# Patient Record
Sex: Female | Born: 1975 | Race: Black or African American | Hispanic: No | Marital: Single | Smoking: Current some day smoker
Health system: Southern US, Community
[De-identification: ages and names within clinical notes are randomized; demographics above are authoritative.]

## PROBLEM LIST (undated history)

## (undated) ENCOUNTER — Ambulatory Visit (HOSPITAL_COMMUNITY): Disposition: A | Discharge status: Other Acute Inpatient Hospital | Payer: Managed Care, Other (non HMO)

## (undated) DIAGNOSIS — T8859XA Other complications of anesthesia, initial encounter: Secondary | ICD-10-CM

## (undated) DIAGNOSIS — Z8489 Family history of other specified conditions: Secondary | ICD-10-CM

## (undated) DIAGNOSIS — F32A Depression, unspecified: Secondary | ICD-10-CM

## (undated) DIAGNOSIS — N390 Urinary tract infection, site not specified: Secondary | ICD-10-CM

## (undated) DIAGNOSIS — J45909 Unspecified asthma, uncomplicated: Secondary | ICD-10-CM

## (undated) DIAGNOSIS — R51 Headache: Secondary | ICD-10-CM

## (undated) DIAGNOSIS — R519 Headache, unspecified: Secondary | ICD-10-CM

## (undated) DIAGNOSIS — L309 Dermatitis, unspecified: Secondary | ICD-10-CM

## (undated) DIAGNOSIS — N289 Disorder of kidney and ureter, unspecified: Secondary | ICD-10-CM

## (undated) DIAGNOSIS — J302 Other seasonal allergic rhinitis: Secondary | ICD-10-CM

## (undated) HISTORY — PX: MOUTH SURGERY: SHX715

## (undated) HISTORY — DX: Other complications of anesthesia, initial encounter: T88.59XA

## (undated) HISTORY — DX: Family history of other specified conditions: Z84.89

---

## 2007-09-03 ENCOUNTER — Inpatient Hospital Stay (HOSPITAL_COMMUNITY): Admission: EM | Admit: 2007-09-03 | Discharge: 2007-09-06 | Payer: Self-pay | Admitting: Emergency Medicine

## 2007-12-03 ENCOUNTER — Emergency Department (HOSPITAL_BASED_OUTPATIENT_CLINIC_OR_DEPARTMENT_OTHER): Admission: EM | Admit: 2007-12-03 | Discharge: 2007-12-04 | Payer: Self-pay | Admitting: Emergency Medicine

## 2007-12-03 ENCOUNTER — Emergency Department (HOSPITAL_COMMUNITY): Admission: EM | Admit: 2007-12-03 | Discharge: 2007-12-03 | Payer: Self-pay | Admitting: Emergency Medicine

## 2007-12-06 ENCOUNTER — Emergency Department (HOSPITAL_COMMUNITY): Admission: EM | Admit: 2007-12-06 | Discharge: 2007-12-07 | Payer: Self-pay | Admitting: Emergency Medicine

## 2009-03-29 IMAGING — CR DG CHEST 1V PORT
1 series · 1 of 1 positions shown · non-contrast
Comparison: 09/04/2007

CLINICAL DATA: 31-year female chest pain

PORTABLE CHEST - 1 VIEW

[AP]
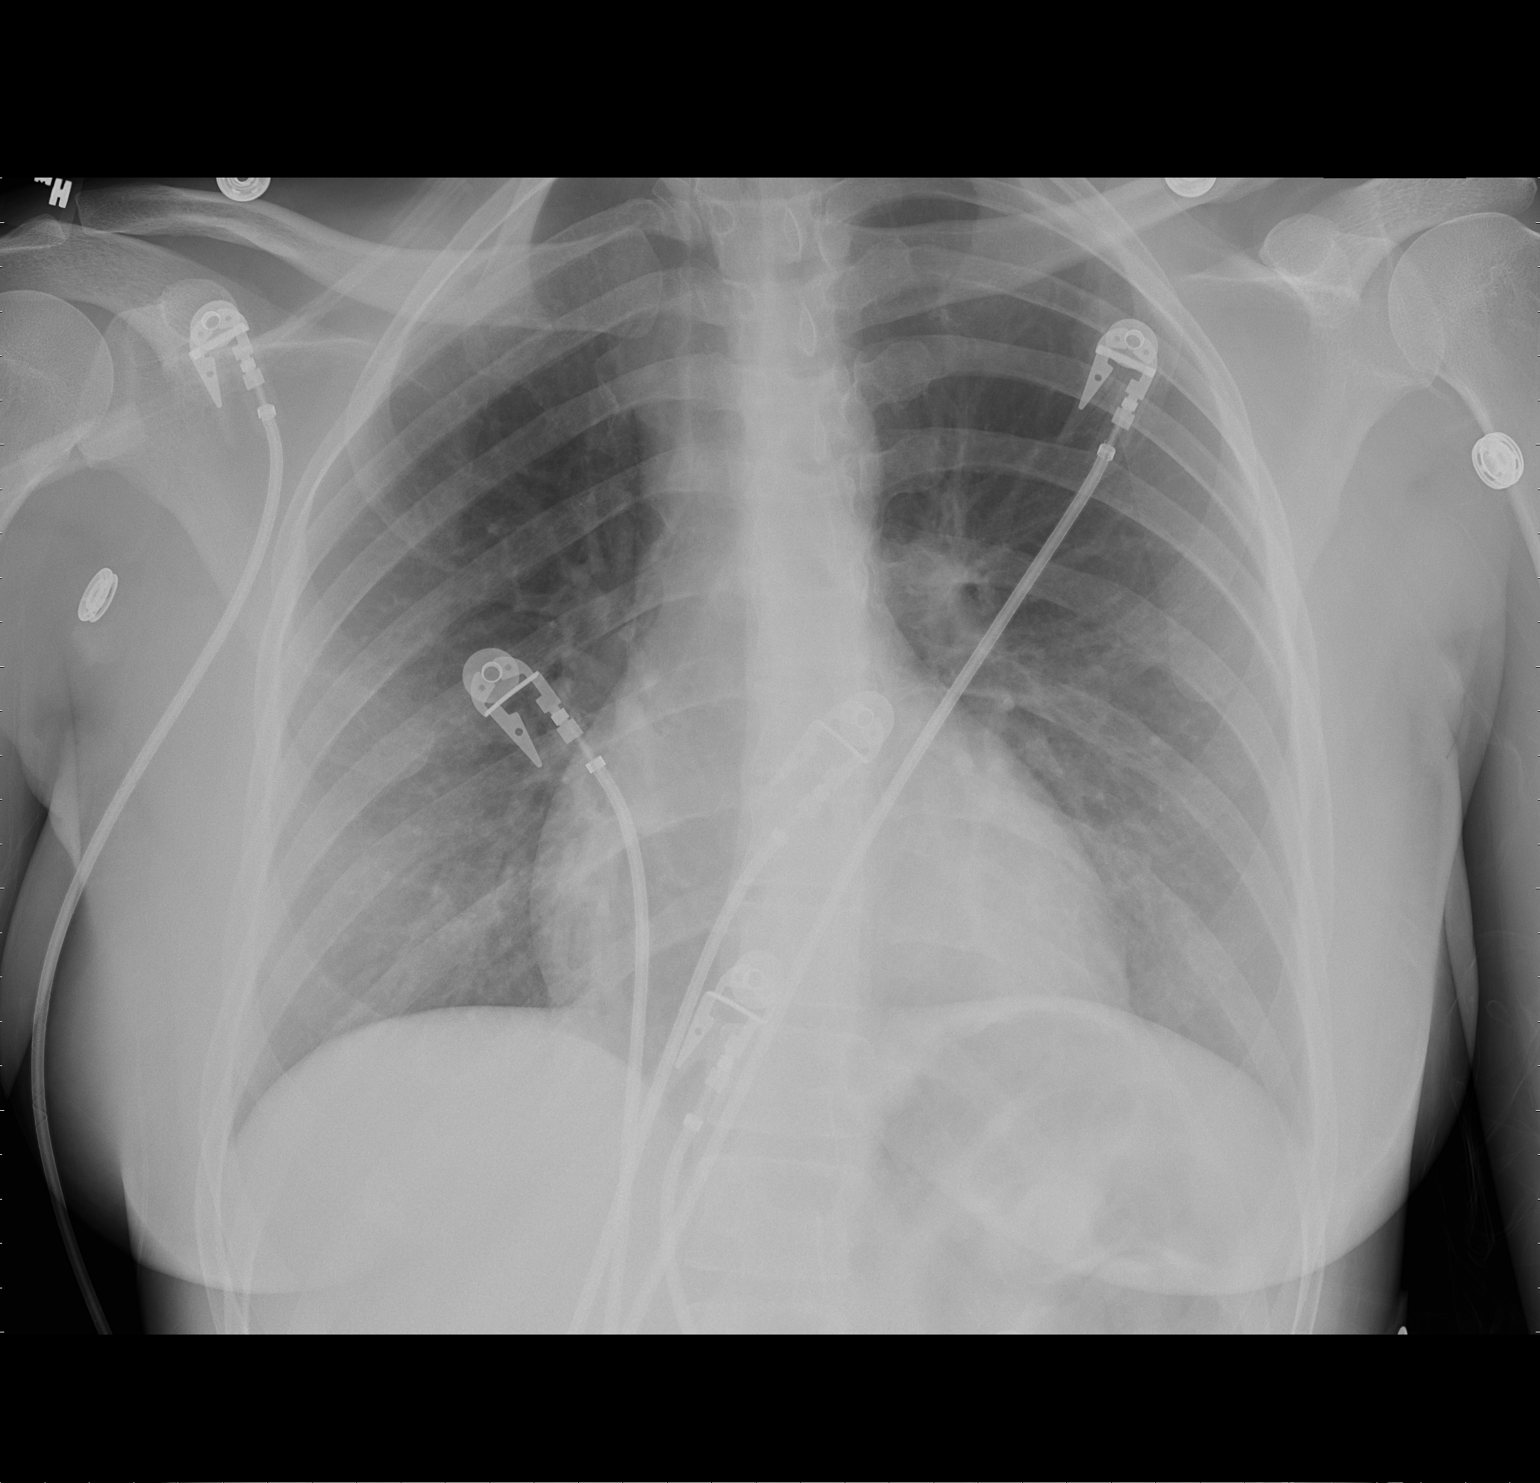

[1 of 1 positions shown; findings below may reference images not displayed]

FINDINGS: Normal heart size and vascularity.  Lungs remain clear.
No edema, pneumonia, effusion or pneumothorax.  Trachea is midline.
IMPRESSION: No acute finding.

## 2009-05-06 ENCOUNTER — Emergency Department (HOSPITAL_COMMUNITY): Admission: EM | Admit: 2009-05-06 | Discharge: 2009-05-06 | Payer: Self-pay | Admitting: Emergency Medicine

## 2009-05-08 ENCOUNTER — Emergency Department (HOSPITAL_COMMUNITY): Admission: EM | Admit: 2009-05-08 | Discharge: 2009-05-08 | Payer: Self-pay | Admitting: Emergency Medicine

## 2009-06-27 ENCOUNTER — Emergency Department (HOSPITAL_COMMUNITY): Admission: EM | Admit: 2009-06-27 | Discharge: 2009-06-27 | Payer: Self-pay | Admitting: Emergency Medicine

## 2010-08-23 LAB — URINALYSIS, ROUTINE W REFLEX MICROSCOPIC
Glucose, UA: NEGATIVE mg/dL
Ketones, ur: 15 mg/dL — AB
Nitrite: NEGATIVE
Protein, ur: 30 mg/dL — AB

## 2010-08-23 LAB — URINE MICROSCOPIC-ADD ON

## 2010-09-09 LAB — RAPID STREP SCREEN (MED CTR MEBANE ONLY): Streptococcus, Group A Screen (Direct): NEGATIVE

## 2010-10-20 NOTE — H&P (Signed)
[name], [name] NO.:  [id number]   MEDICAL RECORD NO.:  [id number]          PATIENT TYPE:  EMS   LOCATION:  MAJO                         FACILITY:  [facility]   PHYSICIAN:  Lonia Blood, M.D.      DATE OF BIRTH:  03/09/1976   DATE OF ADMISSION:  09/02/2007  DATE OF DISCHARGE:                              HISTORY & PHYSICAL   PRIMARY CARE PHYSICIAN:  The patient is unassigned to [place].   PRESENTING COMPLAINT:  Nausea, vomiting and fever.   HISTORY OF PRESENT ILLNESS:  The patient is a 35 year old female that  presented with fever, chills, nausea, vomiting and flank pain.  Symptoms  started 3 days ago and they progressively worsened to the point that the  patient could not stay home.  She could not also keep anything down.  Hence, she decided to come to the emergency room.  She is currently  shivering, still having fever.  Denied any abdominal pain.  Denied any  diarrhea.  No hematochezia.  No hematemesis.   PAST MEDICAL HISTORY:  Significant only for asthma.   ALLERGIES:  She is allergic to PENICILLINS.   MEDICATIONS:  Only albuterol inhaler.   SOCIAL HISTORY:  The patient denied any tobacco or use.  She is a social  alcohol drinker.   FAMILY HISTORY:  Denied any significant family history.   REVIEW OF SYSTEMS:  Mainly weakness, tiredness.  Otherwise, 12 point  review of systems as per HPI.   EXAM:  Temperature 101.5, blood pressure 105/74, pulse 102, respiratory  rate 20, saturation 100% on room air.  GENERAL:  The patient is awake, alert, oriented, but in obvious  distress, shaking and covered with multiple blankets.  HEENT: PERRL.  EOMI.  Mucous membrane looks slightly dry.  NECK:  Supple.  No JVD, no lymphadenopathy.  RESPIRATORY:  She has good air entry bilaterally.  No wheezes or rales.  CARDIOVASCULAR:  She is mildly tachycardiac.  ABDOMEN:  Soft and nontender with positive bowel sounds.  EXTREMITIES:  No edema, cyanosis or clubbing.  The patient  also has right CVA tenderness.   Her labs showed white count 19.5 thousand with a left shift, ANC of  17.2, hemoglobin 14.5, platelet count 207.  Pregnancy test is negative.  Sodium 135, potassium 2.9, chloride 99, CO2 25, glucose 107, BUN 6,  creatinine 0.9 and calcium 9.1, total protein 8.0, albumin 3.9, AST 26.  Lipase is 15.  Urinalysis showed amber, cloudy urine with moderate  hemoglobin, small bilirubin, ketonuria, mild proteinuria, .  nitrite  positive, and large leukocyte esterase.  Wbc's 21-50, rbc's 306, and  many bacteria.   ASSESSMENT:  Therefore, this is a 35 year old female presenting with  fever, chills, nausea and vomiting, with all symptoms of systemic  inflammatory response syndrome, with evidence of urinary tract  infection.  The most likely thing, therefore, is that the patient has an  acute pyelonephritis, especially with the costovertebral angle  tenderness.  The patient is shivering, still having fevers, hence she is  not being controlled with oral medications.   PLAN:  1. Acute pyelonephritis.  We will admit the  patient to a regular bed.      We will start IV ciprofloxacin.  Get blood cultures, urine culture.      Follow her progress in the hospital.  2. Hypokalemia.  We will replete potassium and observe the patient in      the hospital.  Will follow her potassium level closely.  3. Asthma.  The patient is not having any asthmatic attack.  However,      we will keep her on some albuterol and Atrovent nebulizers as      needed while in the hospital.   Otherwise, the patient is expected to make a full recovery soon and will  be discharged home accordingly.      Lonia Blood, M.D.  Electronically Signed     LG/MEDQ  D:  09/03/2007  T:  09/04/2007  Job:  [id number]

## 2010-10-23 NOTE — Discharge Summary (Signed)
[name], [name] NO.:  [id number]   MEDICAL RECORD NO.:  [id number]          PATIENT TYPE:  INP   LOCATION:  5506                         FACILITY:  [facility]   PHYSICIAN:  Lonia Blood, M.D.       DATE OF BIRTH:  February 06, 1976   DATE OF ADMISSION:  09/03/2007  DATE OF DISCHARGE:  09/06/2007                               DISCHARGE SUMMARY   PRIMARY CARE PHYSICIAN:  The patient's primary care physician is in  [place]   DISCHARGE DIAGNOSES:  1. Escherichia coli urinary tract infection.  2. Dehydration - resolved.  3. History of asthma.   DISCHARGE MEDICATIONS:  Ciprofloxacin 500 mg by mouth twice a day for 1  week.   CONDITION ON DISCHARGE:  The patient was discharged in good condition  without any need for immediate followup.  She will return to [place] to follow up with her primary care physician as needed.   PROCEDURES DURING THIS ADMISSION:  1. On September 03, 2007, the patient had an ultrasound of the abdomen      with findings of normal-appearing kidneys.  2. On September 04, 2007, CT scan of the chest, negative for pulmonary      embolus and right pyelonephritis.   HISTORY AND PHYSICAL:  Refer to the dictated H&P, which was done by Dr.  Mikeal Hawthorne on September 02, 2007.   HOSPITAL COURSE:  1. Acute pyelonephritis.  [name] was admitted to regular bed      with presumed diagnosis of acute pyelonephritis.  Urine culture      grew Escherichia coli, sensitive to ciprofloxacin.  CT scan did      confirm presence of right pyelonephritis.  [name] was placed      on intravenous fluids, intravenous ciprofloxacin, and she did      improve while in the hospital.  Blood cultures were negative.      Urine culture indicated and isolated, was sensitive to the      antibiotic used.  On September 06, 2007, as the patient was able      tolerate a good regular diet, we decided to discharge the patient      home with a course of 1 week of oral  ciprofloxacin.      Lonia Blood, M.D.  Electronically Signed     SL/MEDQ  D:  09/12/2007  T:  09/13/2007  Job:  [id number]

## 2011-03-01 LAB — CBC
HCT: 35.6 — ABNORMAL LOW
Hemoglobin: 11.8 — ABNORMAL LOW
Hemoglobin: 12.1
MCHC: 33.8
MCHC: 33.8
MCHC: 34
MCV: 92.6
MCV: 92.7
Platelets: 207
RBC: 3.85 — ABNORMAL LOW
RBC: 4.64
RDW: 13
RDW: 13.7
WBC: 9.3

## 2011-03-01 LAB — CULTURE, BLOOD (ROUTINE X 2)
Culture: NO GROWTH
Culture: NO GROWTH

## 2011-03-01 LAB — DIFFERENTIAL
Eosinophils Absolute: 0
Eosinophils Relative: 0
Lymphocytes Relative: 5 — ABNORMAL LOW
Lymphs Abs: 1
Monocytes Relative: 6

## 2011-03-01 LAB — URINALYSIS, ROUTINE W REFLEX MICROSCOPIC
Ketones, ur: >80 — AB
Nitrite: POSITIVE — AB
Urobilinogen, UA: 1
pH: 6.5

## 2011-03-01 LAB — COMPREHENSIVE METABOLIC PANEL
ALT: 16
ALT: 19
AST: 26
Albumin: 3.9
CO2: 20
Calcium: 8.2 — ABNORMAL LOW
Calcium: 9.1
Creatinine, Ser: 0.92
GFR calc Af Amer: >60
GFR calc non Af Amer: >60
Glucose, Bld: 95
Sodium: 135
Sodium: 136
Total Protein: 6.4
Total Protein: 8

## 2011-03-01 LAB — URINE CULTURE
Colony Count: >=100000
Special Requests: NEGATIVE

## 2011-03-01 LAB — URINE MICROSCOPIC-ADD ON

## 2011-03-01 LAB — BASIC METABOLIC PANEL
CO2: 21
Chloride: 107
Creatinine, Ser: 0.81
GFR calc Af Amer: >60
Potassium: 3.7
Sodium: 137

## 2011-03-02 LAB — CBC
HCT: 37.7
Hemoglobin: 12.8
MCHC: 34
MCV: 91.7
Platelets: 219
RBC: 4.11
RDW: 13.3
WBC: 8.3

## 2011-03-02 LAB — BASIC METABOLIC PANEL
BUN: 2 — ABNORMAL LOW
CO2: 25
Calcium: 8.2 — ABNORMAL LOW
Chloride: 106
Creatinine, Ser: 0.76
GFR calc Af Amer: >60
Glucose, Bld: 121 — ABNORMAL HIGH

## 2011-03-04 LAB — DIFFERENTIAL
Basophils Absolute: 0
Basophils Relative: 0
Eosinophils Relative: 0
Lymphocytes Relative: 16
Lymphocytes Relative: 9 — ABNORMAL LOW
Lymphs Abs: 1.2
Monocytes Relative: 8
Neutro Abs: 6.9
Neutrophils Relative %: 76

## 2011-03-04 LAB — CBC
Hemoglobin: 14.3
MCHC: 33.5
MCHC: 33.9
MCV: 90
Platelets: 219
Platelets: 210
RDW: 13.1
RDW: 13.2

## 2011-03-04 LAB — URINE MICROSCOPIC-ADD ON

## 2011-03-04 LAB — URINALYSIS, ROUTINE W REFLEX MICROSCOPIC
Bilirubin Urine: NEGATIVE
Glucose, UA: NEGATIVE
Glucose, UA: NEGATIVE
Glucose, UA: NEGATIVE
Ketones, ur: >80 — AB
Leukocytes, UA: NEGATIVE
Nitrite: NEGATIVE
Protein, ur: 100 — AB
Protein, ur: NEGATIVE
Specific Gravity, Urine: 1.03
Specific Gravity, Urine: 1.033 — ABNORMAL HIGH
Urobilinogen, UA: 1
pH: 6
pH: 6.5

## 2011-03-04 LAB — COMPREHENSIVE METABOLIC PANEL
AST: 15
Albumin: 3.6
CO2: 19
Calcium: 9
Creatinine, Ser: 0.94
GFR calc Af Amer: >60
GFR calc non Af Amer: >60
Sodium: 137
Total Protein: 7.5

## 2011-03-04 LAB — URINE CULTURE
Colony Count: NO GROWTH
Culture: NO GROWTH

## 2011-03-04 LAB — POCT I-STAT, CHEM 8
Chloride: 103
Glucose, Bld: 98
HCT: 46
Hemoglobin: 15.6 — ABNORMAL HIGH
Potassium: 3.5
Sodium: 139

## 2011-03-04 LAB — D-DIMER, QUANTITATIVE: D-Dimer, Quant: 0.34

## 2011-03-04 LAB — POCT CARDIAC MARKERS
CKMB, poc: <1 — ABNORMAL LOW
CKMB, poc: <1 — ABNORMAL LOW
Operator id: 285491
Troponin i, poc: <0.05

## 2011-03-04 LAB — POCT PREGNANCY, URINE: Operator id: 264421

## 2011-03-04 LAB — LIPASE, BLOOD: Lipase: 15

## 2013-11-24 ENCOUNTER — Emergency Department (HOSPITAL_COMMUNITY)
Admission: EM | Admit: 2013-11-24 | Discharge: 2013-11-24 | Disposition: A | Discharge status: Home / Self Care | Payer: PRIVATE HEALTH INSURANCE | Attending: Emergency Medicine | Admitting: Emergency Medicine

## 2013-11-24 ENCOUNTER — Encounter (HOSPITAL_COMMUNITY): Payer: Self-pay | Admitting: Emergency Medicine

## 2013-11-24 DIAGNOSIS — IMO0002 Reserved for concepts with insufficient information to code with codable children: Secondary | ICD-10-CM | POA: Diagnosis not present

## 2013-11-24 DIAGNOSIS — T23231A Burn of second degree of multiple right fingers (nail), not including thumb, initial encounter: Secondary | ICD-10-CM

## 2013-11-24 DIAGNOSIS — T23269A Burn of second degree of back of unspecified hand, initial encounter: Secondary | ICD-10-CM | POA: Insufficient documentation

## 2013-11-24 DIAGNOSIS — F17200 Nicotine dependence, unspecified, uncomplicated: Secondary | ICD-10-CM | POA: Diagnosis not present

## 2013-11-24 DIAGNOSIS — Y9269 Other specified industrial and construction area as the place of occurrence of the external cause: Secondary | ICD-10-CM | POA: Insufficient documentation

## 2013-11-24 DIAGNOSIS — Z87448 Personal history of other diseases of urinary system: Secondary | ICD-10-CM | POA: Insufficient documentation

## 2013-11-24 DIAGNOSIS — Z880 Allergy status to penicillin: Secondary | ICD-10-CM | POA: Diagnosis not present

## 2013-11-24 DIAGNOSIS — Z79899 Other long term (current) drug therapy: Secondary | ICD-10-CM | POA: Diagnosis not present

## 2013-11-24 DIAGNOSIS — Y9389 Activity, other specified: Secondary | ICD-10-CM | POA: Diagnosis not present

## 2013-11-24 DIAGNOSIS — T23029A Burn of unspecified degree of unspecified single finger (nail) except thumb, initial encounter: Secondary | ICD-10-CM | POA: Diagnosis present

## 2013-11-24 HISTORY — DX: Disorder of kidney and ureter, unspecified: N28.9

## 2013-11-24 MED ORDER — SILVER SULFADIAZINE 1 % EX CREA
TOPICAL_CREAM | Freq: Once | CUTANEOUS | Status: AC
  Administered 2013-11-24: 06:00:00 via TOPICAL
  Filled 2013-11-24: qty 85

## 2013-11-24 MED ORDER — HYDROCODONE-ACETAMINOPHEN 5-325 MG PO TABS: 1.0000 | ORAL_TABLET | ORAL | Status: DC | PRN

## 2013-11-24 MED ORDER — HYDROCODONE-ACETAMINOPHEN 5-325 MG PO TABS
1.0000 | ORAL_TABLET | Freq: Once | ORAL | Status: AC
  Administered 2013-11-24: 1 via ORAL
  Filled 2013-11-24: qty 1

## 2013-11-24 MED ORDER — SILVER SULFADIAZINE 1 % EX CREA
TOPICAL_CREAM | Freq: Every day | CUTANEOUS | Status: DC
Start: 2013-11-24 — End: 2015-12-24

## 2013-11-24 NOTE — ED Notes (Signed)
Pt states she was at work and was burned on her right hand by a "heat activator" cleaning type solution.

## 2013-11-24 NOTE — ED Provider Notes (Signed)
CSN: [id number]     Arrival date & time 11/24/13  0459 History   First MD Initiated Contact with Patient 11/24/13 [phone]     Chief Complaint  Patient presents with  . Burn     (Consider location/radiation/quality/duration/timing/severity/associated sxs/prior Treatment) HPI Patient sustained a chemical burn to her right hand while at work. She states she got a few splashes on the dorsum of her right hand. She's having pain at the site. There is one small blister. There is no circumferential burn. Patient denies any other injury. She has no numbness or decreased range of motion. Past Medical History  Diagnosis Date  . Renal disorder    History reviewed. No pertinent past surgical history. No family history on file. History  Substance Use Topics  . Smoking status: Current Every Day Smoker -- 0.50 packs/day    Types: Cigarettes  . Smokeless tobacco: Not on file  . Alcohol Use: Yes     Comment: socially   OB History   Grav Para Term Preterm Abortions TAB SAB Ect Mult Living                 Review of Systems  Constitutional: Negative for fever and chills.  Gastrointestinal: Negative for nausea and vomiting.  Skin: Positive for rash and wound.  Neurological: Negative for dizziness, weakness and headaches.  All other systems reviewed and are negative.     Allergies  Penicillins  Home Medications   Prior to Admission medications   Medication Sig Start Date End Date Taking? Authorizing Provider  BIOTIN PO Take 1 tablet by mouth daily.   Yes Historical Provider, MD  calcium carbonate (OS-CAL) 600 MG TABS tablet Take 600 mg by mouth 2 (two) times daily with a meal.   Yes Historical Provider, MD  Multiple Vitamin (MULTIVITAMIN WITH MINERALS) TABS tablet Take 1 tablet by mouth daily.   Yes Historical Provider, MD   BP 118/76  Pulse 91  Temp(Src) 98.6 F (37 C) (Oral)  Resp 20  SpO2 98% Physical Exam  Nursing note and vitals reviewed. Constitutional: She is oriented to  person, place, and time. She appears well-developed and well-nourished. No distress.  HENT:  Head: Normocephalic and atraumatic.  Mouth/Throat: Oropharynx is clear and moist.  Eyes: EOM are normal. Pupils are equal, round, and reactive to light.  Neck: Normal range of motion. Neck supple.  Cardiovascular: Normal rate and regular rhythm.   Pulmonary/Chest: Effort normal and breath sounds normal.  Abdominal: Soft. Bowel sounds are normal.  Musculoskeletal: Normal range of motion. She exhibits no edema and no tenderness.  Neurological: She is alert and oriented to person, place, and time.  Sensation intact  Skin: Skin is warm and dry. No rash noted. No erythema.  Superficial partial-thickness burns to the dorsum of the right hand. The most significant area is between the second and third digit in the web space. There is a small bulla. There is no circumferential burning. Total burn surface area it is much less than 1%  Psychiatric: She has a normal mood and affect. Her behavior is normal.    ED Course  Procedures (including critical care time) Labs Review Labs Reviewed - No data to display  Imaging Review No results found.   EKG Interpretation None      MDM   Final diagnoses:  None    Will apply Silvadene and dressing wound. Patient educated on signs of infection and when to return. I do not believe that she needs followup given the  sparse burn pattern.     Loren Raceravid Yelverton, MD 11/24/13 [phone]

## 2013-11-24 NOTE — Discharge Instructions (Signed)
Burn Care Your skin is a natural barrier to infection. It is the largest organ of your body. Burns damage this natural protection. To help prevent infection, it is very important to follow your caregiver's instructions in the care of your burn. Burns are classified as:  First degree. There is only redness of the skin (erythema). No scarring is expected.  Second degree. There is blistering of the skin. Scarring may occur with deeper burns.  Third degree. All layers of the skin are injured, and scarring is expected. HOME CARE INSTRUCTIONS   Wash your hands well before changing your bandage.  Change your bandage as often as directed by your caregiver.  Remove the old bandage. If the bandage sticks, you may soak it off with cool, clean water.  Cleanse the burn thoroughly but gently with mild soap and water.  Pat the area dry with a clean, dry cloth.  Apply a thin layer of antibacterial cream to the burn.  Apply a clean bandage as instructed by your caregiver.  Keep the bandage as clean and dry as possible.  Elevate the affected area for the first 24 hours, then as instructed by your caregiver.  Only take over-the-counter or prescription medicines for pain, discomfort, or fever as directed by your caregiver. SEEK IMMEDIATE MEDICAL CARE IF:   You develop excessive pain.  You develop redness, tenderness, swelling, or red streaks near the burn.  The burned area develops yellowish-white fluid (pus) or a bad smell.  You have a fever. MAKE SURE YOU:   Understand these instructions.  Will watch your condition.  Will get help right away if you are not doing well or get worse. Document Released: 05/24/2005 Document Revised: 08/16/2011 Document Reviewed: 10/14/2010 Exit[employer] Patient Information 2015 [employer]. This information is not intended to replace advice given to you by your health care provider. Make sure you discuss any questions you have with your health care  provider.  

## 2014-04-22 ENCOUNTER — Encounter (HOSPITAL_COMMUNITY): Payer: Self-pay | Admitting: Emergency Medicine

## 2014-04-22 ENCOUNTER — Emergency Department (HOSPITAL_COMMUNITY): Payer: PRIVATE HEALTH INSURANCE

## 2014-04-22 ENCOUNTER — Emergency Department (HOSPITAL_COMMUNITY)
Admission: EM | Admit: 2014-04-22 | Discharge: 2014-04-22 | Disposition: A | Discharge status: Home / Self Care | Payer: PRIVATE HEALTH INSURANCE | Attending: Emergency Medicine | Admitting: Emergency Medicine

## 2014-04-22 DIAGNOSIS — Y9289 Other specified places as the place of occurrence of the external cause: Secondary | ICD-10-CM | POA: Insufficient documentation

## 2014-04-22 DIAGNOSIS — Z880 Allergy status to penicillin: Secondary | ICD-10-CM | POA: Diagnosis not present

## 2014-04-22 DIAGNOSIS — Z79899 Other long term (current) drug therapy: Secondary | ICD-10-CM | POA: Diagnosis not present

## 2014-04-22 DIAGNOSIS — Z720 Tobacco use: Secondary | ICD-10-CM | POA: Diagnosis not present

## 2014-04-22 DIAGNOSIS — S6992XA Unspecified injury of left wrist, hand and finger(s), initial encounter: Secondary | ICD-10-CM | POA: Diagnosis not present

## 2014-04-22 DIAGNOSIS — Y9389 Activity, other specified: Secondary | ICD-10-CM | POA: Diagnosis not present

## 2014-04-22 DIAGNOSIS — M25532 Pain in left wrist: Secondary | ICD-10-CM

## 2014-04-22 DIAGNOSIS — Z87448 Personal history of other diseases of urinary system: Secondary | ICD-10-CM | POA: Diagnosis not present

## 2014-04-22 DIAGNOSIS — X58XXXA Exposure to other specified factors, initial encounter: Secondary | ICD-10-CM | POA: Insufficient documentation

## 2014-04-22 DIAGNOSIS — Y998 Other external cause status: Secondary | ICD-10-CM | POA: Insufficient documentation

## 2014-04-22 DIAGNOSIS — R52 Pain, unspecified: Secondary | ICD-10-CM

## 2014-04-22 MED ORDER — HYDROCODONE-ACETAMINOPHEN 5-325 MG PO TABS
ORAL_TABLET | ORAL | Status: DC
Start: 2014-04-22 — End: 2015-12-24

## 2014-04-22 NOTE — ED Notes (Signed)
Patient having pain in left wrist and she has shooting pain up her arm when she tries to pick anything up heavier than 2lbs.

## 2014-04-22 NOTE — ED Provider Notes (Signed)
CSN: [id number]     Arrival date & time 04/22/14  [id number] History   First MD Initiated Contact with Patient 04/22/14 0247     Chief Complaint  Patient presents with  . Wrist Pain      HPI Pt was seen at 0240. Per pt, c/o gradual onset and persistence of constant left wrist "pain" for the past 2 weeks. Pt states the pain worsens when she tries to "pick something up over 2 lbs," and moves her wrist F/E.  Pt states the pain "shoots up the side of my forearm." Pt works at a [employer] and is right handed. Denies direct injury. Denies focal motor weakness, no tingling/numbness in extremity.    Past Medical History  Diagnosis Date  . Renal disorder    History reviewed. No pertinent past surgical history.  History  Substance Use Topics  . Smoking status: Current Every Day Smoker -- 0.50 packs/day    Types: Cigarettes  . Smokeless tobacco: Not on file  . Alcohol Use: Yes     Comment: socially    Review of Systems ROS: Statement: All systems negative except as marked or noted in the HPI; Constitutional: Negative for fever and chills. ; ; Eyes: Negative for eye pain, redness and discharge. ; ; ENMT: Negative for ear pain, hoarseness, nasal congestion, sinus pressure and sore throat. ; ; Cardiovascular: Negative for chest pain, palpitations, diaphoresis, dyspnea and peripheral edema. ; ; Respiratory: Negative for cough, wheezing and stridor. ; ; Gastrointestinal: Negative for nausea, vomiting, diarrhea, abdominal pain, blood in stool, hematemesis, jaundice and rectal bleeding. . ; ; Genitourinary: Negative for dysuria, flank pain and hematuria. ; ; Musculoskeletal: +left wrist pain. Negative for back pain and neck pain. Negative for swelling and trauma.; ; Skin: Negative for pruritus, rash, abrasions, blisters, bruising and skin lesion.; ; Neuro: Negative for headache, lightheadedness and neck stiffness. Negative for weakness, altered level of consciousness , altered mental status,  extremity weakness, paresthesias, involuntary movement, seizure and syncope.      Allergies  Penicillins  Home Medications   Prior to Admission medications   Medication Sig Start Date End Date Taking? Authorizing Provider  BIOTIN PO Take 1 tablet by mouth daily.   Yes Historical Provider, MD  calcium carbonate (OS-CAL) 600 MG TABS tablet Take 600 mg by mouth 2 (two) times daily with a meal.   Yes Historical Provider, MD  Multiple Vitamin (MULTIVITAMIN WITH MINERALS) TABS tablet Take 1 tablet by mouth daily.   Yes Historical Provider, MD  Specialty Vitamins Products (ECHINACEA C COMPLETE PO) Take 1 tablet by mouth daily.   Yes Historical Provider, MD  HYDROcodone-acetaminophen (NORCO) 5-325 MG per tablet Take 1 tablet by mouth every 4 (four) hours as needed. Patient not taking: Reported on 04/22/2014 11/24/13   Loren Raceravid Yelverton, MD  silver sulfADIAZINE (SILVADENE) 1 % cream Apply topically daily. To affected area Patient not taking: Reported on 04/22/2014 11/24/13   Loren Raceravid Yelverton, MD   BP 129/87 mmHg  Pulse 86  Temp(Src) 98.3 F (36.8 C) (Oral)  Resp 16  Ht 5\' 2"  (1.575 m)  Wt 128 lb (58.06 kg)  BMI 23.41 kg/m2  SpO2 100%  LMP 04/15/2014 Physical Exam 0245: Physical examination:  Nursing notes reviewed; Vital signs and O2 SAT reviewed;  Constitutional: Well developed, Well nourished, Well hydrated, In no acute distress; Head:  Normocephalic, atraumatic; Eyes: EOMI, PERRL, No scleral icterus; ENMT: Mouth and pharynx normal, Mucous membranes moist; Neck: Supple, Full range of motion, No lymphadenopathy;  Cardiovascular: Regular rate and rhythm, No murmur, rub, or gallop; Respiratory: Breath sounds clear & equal bilaterally, No rales, rhonchi, wheezes.  Speaking full sentences with ease, Normal respiratory effort/excursion; Chest: Nontender, Movement normal; Abdomen: Soft, Nontender, Nondistended, Normal bowel sounds;; Extremities: Pulses normal, NT left fingers/hand/elbow. Left wrist  without edema, ecchymosis, erythema or deformity. No left snuffbox tenderness.  No pain to axial thumb or 3rd MCP loading. Left forearm compartments soft, strong radial pp, brisk cap refill in fingers. Left hand NMS intact. Negative Tinel's. +Finkelstein's test..; Neuro: AA&Ox3, Major CN grossly intact.  Speech clear. No gross focal motor or sensory deficits in extremities. Climbs on and off stretcher easily by herself. Gait steady.; Skin: Color normal, Warm, Dry.   ED Course  Procedures     MDM  MDM Reviewed: previous chart, nursing note and vitals Interpretation: x-ray      Dg Wrist Complete Left 04/22/2014   CLINICAL DATA:  Entire wrist pain after an injury while lifting of 5 lb bag of food.  EXAM: LEFT WRIST - COMPLETE 3+ VIEW  COMPARISON:  None.  FINDINGS: There is no evidence of fracture or dislocation. There is no evidence of arthropathy or other focal bone abnormality. Soft tissues are unremarkable.  IMPRESSION: Negative.   Electronically Signed   By: Burman NievesWilliam  Stevens M.D.   On: 04/22/2014 03:27    0410:  XR reassuring. Will splint, f/u occupational health clinic and Ortho MD. Dx and testing d/w pt and family.  Questions answered.  Verb understanding, agreeable to d/c home with outpt f/u.   Samuel JesterKathleen Marianita Botkin, DO 04/24/14 [phone]

## 2014-04-22 NOTE — Discharge Instructions (Signed)
°Emergency Department Resource Guide °1) Find a Doctor and Pay Out of Pocket °Although you won't have to find out who is covered by your insurance plan, it is a good idea to ask around and get recommendations. You will then need to call the office and see if the doctor you have chosen will accept you as a new patient and what types of options they offer for patients who are self-pay. Some doctors offer discounts or will set up payment plans for their patients who do not have insurance, but you will need to ask so you aren't surprised when you get to your appointment. ° °2) Contact Your Local Health Department °Not all health departments have doctors that can see patients for sick visits, but many do, so it is worth a call to see if yours does. If you don't know where your local health department is, you can check in your phone book. The CDC also has a tool to help you locate your state's health department, and many state websites also have listings of all of their local health departments. ° °3) Find a Walk-in Clinic °If your illness is not likely to be very severe or complicated, you may want to try a walk in clinic. These are popping up all over the country in pharmacies, drugstores, and shopping centers. They're usually staffed by nurse practitioners or physician assistants that have been trained to treat common illnesses and complaints. They're usually fairly quick and inexpensive. However, if you have serious medical issues or chronic medical problems, these are probably not your best option. ° °No Primary Care Doctor: °- Call [facility] at  832-8000 - they can help you locate a primary care doctor that  accepts your insurance, provides certain services, etc. °- Physician Referral Service- 1-800-533-3463 ° °Chronic Pain Problems: °Organization         Address  Phone   Notes  °[facility]  (336) 297-2271 Patients need to be referred by their primary care doctor.  ° °Medication  Assistance: °Organization         Address  Phone   Notes  °[place] [facility] [address] °[place], [place] [address] (336) 641-8030 --Must be a resident of [place] °-- Must have NO insurance coverage whatsoever (no Medicaid/ Medicare, etc.) °-- The pt. MUST have a primary care doctor that directs their care regularly and follows them in the community °  °[facility]  (866) 331-1348   °[employer]  (888) 892-1162   ° °Agencies that provide inexpensive medical care: °Organization         Address  Phone   Notes  °[facility]  (336) 832-8035   °[facility]    (336) 832-7272   °[facility] [address] °[place], [place] [address] (336) 832-4777   °[facility] [address], °[place] (336) 271-4999   °[facility]    (336) 373-0678   °[facility]    (336) 272-1050   °[facility] ° [address], [place] Phone:  (336) 832-4444, Fax:  (336) 832-4440 Hours of Operation:  9 am - 6 pm, M-F.  Also accepts Medicaid/Medicare and self-pay.  °[facility] ° [address], [place] Phone: (336) [phone], Fax: (336) [phone]. Hours of Operation:  8:30 am - 5:30 pm, M-F.  Also accepts Medicaid and self-pay.  °[facility] [place] [address], [place] Phone: (336) 878-6027   °[facility] [address], [place], [place] (336)723-1848, Ext. 123 Mondays & Thursdays: 7-9 AM.  First 15 patients are seen on a first come, first serve basis. °  ° °Medicaid-accepting [place] Providers: ° °Organization         Address  Phone   Notes  °[facility] [address], [place] (336) 641-2100 Also accepts self-pay patients.  °[facility] [address], [place] ° (336) 856-9996   °[facility] [address], [place]  (336) 288-8857   °[facility] [address] [place] [address], [place] (336) 299-7000   °Veita Bland [address], [place]  ° (336) 373-1557 Only accepts [place] Access Medicaid patients after they have their name applied to their card.  ° °Self-Pay (no insurance) in [place]: ° °Organization         Address  Phone   Notes  °Sickle Cell Patients, [facility] [address], [place] (336) 832-1970   °[facility] [address], [place] (336) 832-4400   °[facility] ° [address], [place] (336) 992-4800   °[facility]/Dr. Osei-Bonsu ° [address] [place] [address], [place] or [address], [place] (336) 841-8500 Phone number for both [place] and [place] locations is the same.  °[facility] [address], [place] (336) 299-0000   °[facility] [address] [place] [address], [place] or [address] (336) 852-7530 °(336) 878-2260   °[facility] [address], [place] (336) 350-1642, phone; (336) 294-5005, fax Sees patients 1st and 3rd Saturday of every month.  Must not qualify for public or private insurance (i.e. Medicaid, Medicare, [place] Health Choice, Veterans' Benefits) • Household income should be no more than 200% of the poverty level •The clinic cannot treat you if you are pregnant or think you are pregnant • Sexually transmitted diseases are not treated at the clinic.  ° ° °Dental Care: °Organization         Address  Phone  Notes  °[place] [facility] [address], [place] (336) 641-6152 Accepts children up to age 21 who are enrolled in Medicaid or [place] Health Choice; pregnant women with a Medicaid card; and children who have applied for Medicaid or [place] Health Choice, but were declined, whose parents can pay a reduced fee at time of service.  °[place]  [facility] [place]  [address], [place] (336) 641-7733 Accepts children up to age 21 who are enrolled in Medicaid or [place] Health Choice; pregnant women with a Medicaid card; and children who have applied for Medicaid or [place] Health Choice, but were declined, whose parents can pay a reduced fee at time of service.  °[facility] ° [address], [place] (336) 641-4533 Patients are seen by appointment only. Walk-ins are not accepted. [facility] will see patients 18 years of age and older. °Monday - Tuesday (8am-5pm) °Most Wednesdays (8:30-5pm) °$30 per visit, cash only  °[facility] ° [address], [place] (336) 641-4533 Patients are seen by appointment only. Walk-ins are not accepted. [facility] will see patients 18 years of age and older. °One   Wednesday Evening (Monthly: Volunteer Based).  $30 per visit, cash only  °[facility]  (919) 537-3737 for adults; Children under age 4, call Graduate Pediatric Dentistry at ([phone]) [phone]. Children aged 4-14, please call (919) [phone] to request a pediatric application. ° Dental services are provided in all areas of dental care including fillings, crowns and bridges, complete and partial dentures, implants, gum treatment, root canals, and extractions. Preventive care is also provided. Treatment is provided to both adults and children. °Patients are selected via a lottery and there is often a waiting list. °  °[facility] [address], °[place] ° (336) 763-8833 [email or web address] °  °[facility] [address], [place], [place] (336)723-1848, Ext. 123 Second and Fourth Thursday of each month, opens at 6:30 AM; Clinic ends at 9 AM.  Patients are seen on a first-come first-served basis, and a limited number are seen during each clinic.  ° °[facility] ° [address], [place], [place] (336) 723-7904    Eligibility Requirements °You must have lived in [place], [place], or [place] counties for at least the last three months. °  You cannot be eligible for state or federal sponsored healthcare insurance, including Veterans Administration, Medicaid, or Medicare. °  You generally cannot be eligible for healthcare insurance through your employer.  °  How to apply: °Eligibility screenings are held every Tuesday and Wednesday afternoon from 1:00 pm until 4:00 pm. You do not need an appointment for the interview!  °[facility] [address], [place], [place] 336-631-2330   °[facility]  336-342-8273   °[facility]  336-703-3100   °[facility]  336-570-6415   ° °Behavioral Health Resources in the Community: °Intensive Outpatient Programs °Organization         Address  Phone  Notes  °[facility] [address], [place], [place] 336-878-6098   °[facility] [address], [place], [place] 336-832-9800   °[facility] [address], [place], [place] ° 336-882-2125   °[facility] [address],  °[place], [place] 1-800-853-5163 or 336-641-4981   °Substance Abuse Resources °Organization         Address  Phone  Notes  °[facility]  336-882-2125   °[facility]  336-784-9470   °[facility]  336-285-9073   °[facility]  336-845-3988   °Residential & Outpatient Substance Abuse Program  1-800-659-3381   °Psychological Services °Organization         Address  Phone  Notes  °[facility]  336- 832-9600   °[facility]  336- 378-7881   °[facility] [address], [place] 1-800-853-5163 or 336-641-4981   ° °Mobile Crisis Teams °Organization         Address  Phone  Notes  °[facility], Mobile Crisis Care Unit  1-877-626-1772   °Assertive °[facility] ° [address]  [place], [place] 336-834-9664   °Sharon DeEsch [address] °[place] 336-554-5454   ° °Self-Help/Support Groups °Organization         Address  Phone             Notes  °[facility]  - variety of support groups  336- 373-1402 Call for more information  °Narcotics Anonymous (NA), [facility] [address], °[place]  2 meetings at this location  ° °  Residential Treatment Programs Organization         Address  Phone  Notes  [facility] [address],    [place]  [phone]   [facility]  [address], [place] [id number], [place], [place] [phone]   [facility] [address], [place] [phone] Admissions: 8am-3pm M-F  [facility] [address]    [place], [place] [phone]   [facility] [address], [place], [place] [phone]   [facility] [address]  [place], [place] [phone]) [phone]   [facility] Intensive Outpatient [address], [place], [place] [phone]   [facility] ([facility] [address]  [place], [place] [phone] or (978)[phone]   [facility] [address] [place], [place] [phone] Accepts Medicaid  [facility] [address]  [place] [phone] Substance Abuse/Addiction Treatment   [facility] Organization         Address  Phone  Notes  [facility]  [phone](888) (586)[phone]   Angie FavaJulie Brannon, PhD [address], [address] [place], [place]   [phone](336) (780)[phone] or [phone](336) [phone]   [facility]   [address] [place], [place] [phone](336) [phone]   [facility] [address], [place], [place] [phone](336) [phone] Insurance/Medicaid/sponsorship through [facility]  [facility] [address]                                    [place], [place] [phone](336) [phone] Therapy/tele-psych/case    [facility] [address]   [place], [place] [phone]) [phone]    Dr. Lolly MustacheArfeen  [phone](336) [phone]   [facility]  [facility] 1) [address], [place] 2) [address], [place] 3)  [address], [place] [phone](336) [phone](336) ([phone]  [phone](336) [phone]   [facility] Child Abuse Hotline 984-156-5038(336) [phone] or (213)[phone](336) [phone] (After Hours)       Take the prescription as directed.  Apply moist heat or ice to the area(s) of discomfort, for 15 minutes at a time, several times per day for the next few days.  Do not fall asleep on a heating or ice pack. Wear the wrist splint until you are seen in follow up. Call the occupational health doctor today to schedule a follow up appointment in the next 2 days. Call the Orthopedic doctor today to schedule an appointment within the next week.   Return to the Emergency Department immediately if worsening.

## 2015-09-21 ENCOUNTER — Ambulatory Visit (HOSPITAL_COMMUNITY)
Admission: EM | Admit: 2015-09-21 | Discharge: 2015-09-21 | Disposition: A | Discharge status: Home / Self Care | Payer: PRIVATE HEALTH INSURANCE | Attending: Family Medicine | Admitting: Family Medicine

## 2015-09-21 ENCOUNTER — Encounter (HOSPITAL_COMMUNITY): Payer: Self-pay | Admitting: Nurse Practitioner

## 2015-09-21 DIAGNOSIS — L0291 Cutaneous abscess, unspecified: Secondary | ICD-10-CM

## 2015-09-21 DIAGNOSIS — R21 Rash and other nonspecific skin eruption: Secondary | ICD-10-CM

## 2015-09-21 HISTORY — DX: Other seasonal allergic rhinitis: J30.2

## 2015-09-21 HISTORY — DX: Unspecified asthma, uncomplicated: J45.909

## 2015-09-21 MED ORDER — PREDNISONE 50 MG PO TABS: 50.0000 mg | ORAL_TABLET | Freq: Every day | ORAL | Status: DC

## 2015-09-21 MED ORDER — SULFAMETHOXAZOLE-TRIMETHOPRIM 800-160 MG PO TABS: 1.0000 | ORAL_TABLET | Freq: Two times a day (BID) | ORAL | Status: AC

## 2015-09-21 NOTE — ED Provider Notes (Signed)
CSN: [id number]     Arrival date & time 09/21/15  1751 History   First MD Initiated Contact with Patient 09/21/15 1834     Chief Complaint  Patient presents with  . Allergies  . Rash   (Consider location/radiation/quality/duration/timing/severity/associated sxs/prior Treatment) HPI Pt with itchy rash both arms, nape of neck and chest that has been present for several days. Also with tender sore lesion on left neck. Not sure what has caused rash. No home treatment.  Past Medical History  Diagnosis Date  . Renal disorder   . Asthma   . Seasonal allergies    History reviewed. No pertinent past surgical history. History reviewed. No pertinent family history. Social History  Substance Use Topics  . Smoking status: Current Every Day Smoker -- 0.50 packs/day    Types: Cigarettes  . Smokeless tobacco: None  . Alcohol Use: Yes     Comment: socially   OB History    No data available     Review of Systems rash Allergies  Penicillins  Home Medications   Prior to Admission medications   Medication Sig Start Date End Date Taking? Authorizing Provider  BIOTIN PO Take 1 tablet by mouth daily.    Historical Provider, MD  calcium carbonate (OS-CAL) 600 MG TABS tablet Take 600 mg by mouth 2 (two) times daily with a meal.    Historical Provider, MD  HYDROcodone-acetaminophen (NORCO/VICODIN) 5-325 MG per tablet 1 or 2 tabs PO q6 hours prn pain 04/22/14   Samuel Jester, DO  Multiple Vitamin (MULTIVITAMIN WITH MINERALS) TABS tablet Take 1 tablet by mouth daily.    Historical Provider, MD  predniSONE (DELTASONE) 50 MG tablet Take 1 tablet (50 mg total) by mouth daily. 09/21/15   Tharon Aquas, PA  silver sulfADIAZINE (SILVADENE) 1 % cream Apply topically daily. To affected area Patient not taking: Reported on 04/22/2014 11/24/13   Loren Racer, MD  Specialty Vitamins Products (ECHINACEA C COMPLETE PO) Take 1 tablet by mouth daily.    Historical Provider, MD   sulfamethoxazole-trimethoprim (BACTRIM DS,SEPTRA DS) 800-160 MG tablet Take 1 tablet by mouth 2 (two) times daily. 09/21/15 09/28/15  Tharon Aquas, PA  sulfamethoxazole-trimethoprim (BACTRIM DS,SEPTRA DS) 800-160 MG tablet Take 1 tablet by mouth 2 (two) times daily. 09/21/15 09/28/15  Tharon Aquas, PA   Meds Ordered and Administered this Visit  Medications - No data to display  BP 114/80 mmHg  Pulse 87  Temp(Src) 99.9 F (37.7 C) (Oral)  Resp 16  SpO2 97% No data found.   Physical Exam NURSES NOTES AND VITAL SIGNS REVIEWED. CONSTITUTIONAL: Well developed, well nourished, no acute distress HEENT: normocephalic, atraumatic EYES: Conjunctiva normal NECK:normal ROM, supple, no adenopathy PULMONARY:No respiratory distress, normal effort MUSCULOSKELETAL: Normal ROM of all extremities,  SKIN: warm and dry, multiple round, red lesions on arms, chest and nape of neck, single tender swollen lesion left neck.  PSYCHIATRIC: Mood and affect, behavior are normal  ED Course  Procedures (including critical care time)  Labs Review Labs Reviewed - No data to display  Imaging Review No results found.   Visual Acuity Review  Right Eye Distance:   Left Eye Distance:   Bilateral Distance:    Right Eye Near:   Left Eye Near:    Bilateral Near:      Rx: prednisone, bactrim   MDM   1. Abscess   2. Rash and nonspecific skin eruption     Patient is reassured that there are no issues that require  transfer to higher level of care at this time or additional tests. Patient is advised to continue home symptomatic treatment. Patient is advised that if there are new or worsening symptoms to attend the emergency department, contact primary care provider, or return to UC. Instructions of care provided discharged home in stable condition.    THIS NOTE WAS GENERATED USING A VOICE RECOGNITION SOFTWARE PROGRAM. ALL REASONABLE EFFORTS  WERE MADE TO PROOFREAD THIS DOCUMENT FOR ACCURACY.  I  have verbally reviewed the discharge instructions with the patient. A printed AVS was given to the patient.  All questions were answered prior to discharge.      Tharon AquasFrank C Yara Tomkinson, PA 09/21/15 Kristopher Oppenheim1927

## 2015-09-21 NOTE — Discharge Instructions (Signed)
Abscess An abscess (boil or furuncle) is an infected area on or under the skin. This area is filled with yellowish-white fluid (pus) and other material (debris). HOME CARE   Only take medicines as told by your doctor.  If you were given antibiotic medicine, take it as directed. Finish the medicine even if you start to feel better.  If gauze is used, follow your doctor's directions for changing the gauze.  To avoid spreading the infection:  Keep your abscess covered with a bandage.  Wash your hands well.  Do not share personal care items, towels, or whirlpools with others.  Avoid skin contact with others.  Keep your skin and clothes clean around the abscess.  Keep all doctor visits as told. GET HELP RIGHT AWAY IF:   You have more pain, puffiness (swelling), or redness in the wound site.  You have more fluid or blood coming from the wound site.  You have muscle aches, chills, or you feel sick.  You have a fever. MAKE SURE YOU:   Understand these instructions.  Will watch your condition.  Will get help right away if you are not doing well or get worse.   This information is not intended to replace advice given to you by your health care provider. Make sure you discuss any questions you have with your health care provider.   Document Released: 11/10/2007 Document Revised: 11/23/2011 Document Reviewed: 08/07/2011 Elsevier Interactive Patient Education [employer]2016 Elsevier Inc.  Allergies An allergy is when your body reacts to a substance in a way that is not normal. An allergic reaction can happen after you:  Eat something.  Breathe in something.  Touch something. WHAT KINDS OF ALLERGIES ARE THERE? You can be allergic to:  Things that are only around during certain seasons, like molds and pollens.  Foods.  Drugs.  Insects.  Animal dander. WHAT ARE SYMPTOMS OF ALLERGIES?  Puffiness (swelling). This may happen on the lips, face, tongue, mouth, or  throat.  Sneezing.  Coughing.  Breathing loudly (wheezing).  Stuffy nose.  Tingling in the mouth.  A rash.  Itching.  Itchy, red, puffy areas of skin (hives).  Watery eyes.  Throwing up (vomiting).  Watery poop (diarrhea).  Dizziness.  Feeling faint or fainting.  Trouble breathing or swallowing.  A tight feeling in the chest.  A fast heartbeat. HOW ARE ALLERGIES DIAGNOSED? Allergies can be diagnosed with:  A medical and family history.  Skin tests.  Blood tests.  A food diary. A food diary is a record of all the foods, drinks, and symptoms you have each day.  The results of an elimination diet. This diet involves making sure not to eat certain foods and then seeing what happens when you start eating them again. HOW ARE ALLERGIES TREATED? There is no cure for allergies, but allergic reactions can be treated with medicine. Severe reactions usually need to be treated at a hospital.  HOW CAN REACTIONS BE PREVENTED? The best way to prevent an allergic reaction is to avoid the thing you are allergic to. Allergy shots and medicines can also help prevent reactions in some cases.   This information is not intended to replace advice given to you by your health care provider. Make sure you discuss any questions you have with your health care provider.   Document Released: 09/18/2012 Document Revised: 06/14/2014 Document Reviewed: 03/05/2014 Elsevier Interactive Patient Education [employer]2016 Elsevier Inc.

## 2015-09-21 NOTE — ED Notes (Signed)
Pt c/o 1 week history eye swelling and drainage, sneezing after working in year. She attributes symptoms to seasonal allergies. She has been taking benadryl with minimal relief. She c/o 3 day history fever, raised red rash progressing from face to arms, scalp then trunk. She has also been taking tylenol for her symptoms. She is alert, breathing easily

## 2015-12-24 ENCOUNTER — Encounter (HOSPITAL_COMMUNITY): Payer: Self-pay | Admitting: Emergency Medicine

## 2015-12-24 ENCOUNTER — Emergency Department (HOSPITAL_COMMUNITY)
Admission: EM | Admit: 2015-12-24 | Discharge: 2015-12-25 | Disposition: A | Discharge status: Home / Self Care | Payer: Self-pay | Attending: Emergency Medicine | Admitting: Emergency Medicine

## 2015-12-24 ENCOUNTER — Emergency Department (HOSPITAL_COMMUNITY): Payer: Self-pay

## 2015-12-24 DIAGNOSIS — M25532 Pain in left wrist: Secondary | ICD-10-CM | POA: Insufficient documentation

## 2015-12-24 DIAGNOSIS — J069 Acute upper respiratory infection, unspecified: Secondary | ICD-10-CM | POA: Insufficient documentation

## 2015-12-24 DIAGNOSIS — J45909 Unspecified asthma, uncomplicated: Secondary | ICD-10-CM | POA: Insufficient documentation

## 2015-12-24 DIAGNOSIS — F17210 Nicotine dependence, cigarettes, uncomplicated: Secondary | ICD-10-CM | POA: Insufficient documentation

## 2015-12-24 MED ORDER — CETIRIZINE HCL 10 MG PO CHEW: 10.0000 mg | CHEWABLE_TABLET | Freq: Every day | ORAL | Status: DC

## 2015-12-24 MED ORDER — NAPROXEN 375 MG PO TABS: 375.0000 mg | ORAL_TABLET | Freq: Two times a day (BID) | ORAL | Status: DC

## 2015-12-24 NOTE — ED Provider Notes (Signed)
History  By signing my name below, I, Earmon PhoenixJennifer Waddell, attest that this documentation has been prepared under the direction and in the presence of [facility] Neese, [place]. Electronically Signed: Earmon PhoenixJennifer Waddell, ED Scribe. 12/24/2015. 11:17 PM.  Chief Complaint  Patient presents with  . Fall  . Nasal Congestion   The history is provided by the patient and medical records. No language interpreter was used.    HPI Comments:  [name] is a 40 y.o. female who presents to the Emergency Department complaining of a fall that occurred two days ago. Pt states she caught herself on an outstretched left hand. She reports moderate pain of the radial aspect of the left wrist with intermittent numbness and tingling. She describes the pain as shooting and states it radiates up her left arm. She has not taken anything for pain. She denies modifying factors. She denies wounds, bruising. Pt reports she normally wears a brace on the left wrist but does not have one on at present. She also reports some nasal congestion that began this morning. She reports associated cough and mild sore throat that resolved on its own. She has not taken anything to treat her symptoms. She denies modifying factors. She denies fever, chills, nausea, vomiting.   Past Medical History  Diagnosis Date  . Renal disorder   . Asthma   . Seasonal allergies    History reviewed. No pertinent past surgical history. No family history on file. Social History  Substance Use Topics  . Smoking status: Current Every Day Smoker -- 0.00 packs/day    Types: Cigarettes  . Smokeless tobacco: None  . Alcohol Use: Yes     Comment: socially   OB History    No data available     Review of Systems  Constitutional: Negative for fever and chills.  HENT: Positive for congestion.   Respiratory: Positive for cough.   Gastrointestinal: Negative for nausea and vomiting.  Musculoskeletal: Positive for arthralgias.       Left wrist and should  pain  Skin: Negative for color change and wound.  All other systems reviewed and are negative.   Allergies  Penicillins  Home Medications   Prior to Admission medications   Medication Sig Start Date End Date Taking? Authorizing Provider  BIOTIN PO Take 1 tablet by mouth daily.    Historical Provider, MD  calcium carbonate (OS-CAL) 600 MG TABS tablet Take 600 mg by mouth 2 (two) times daily with a meal.    Historical Provider, MD  cetirizine (ZYRTEC) 10 MG chewable tablet Chew 1 tablet (10 mg total) by mouth daily. 12/24/15   Hope Orlene OchM Neese, NP  Multiple Vitamin (MULTIVITAMIN WITH MINERALS) TABS tablet Take 1 tablet by mouth daily.    Historical Provider, MD  naproxen (NAPROSYN) 375 MG tablet Take 1 tablet (375 mg total) by mouth 2 (two) times daily. 12/24/15   Hope Orlene OchM Neese, NP  predniSONE (DELTASONE) 50 MG tablet Take 1 tablet (50 mg total) by mouth daily. 09/21/15   Tharon AquasFrank C Patrick, PA  Specialty Vitamins Products (ECHINACEA C COMPLETE PO) Take 1 tablet by mouth daily.    Historical Provider, MD   Triage Vitals: BP 131/87 mmHg  Pulse 98  Temp(Src) 99.2 F (37.3 C) (Oral)  Resp 16  Ht 5' 2.5" (1.588 m)  Wt 121 lb 9 oz (55.14 kg)  BMI 21.87 kg/m2  SpO2 99%  LMP 12/15/2015 Physical Exam  Constitutional: She is oriented to person, place, and time. She appears well-developed and well-nourished.  No distress.  HENT:  Head: Normocephalic.  Right Ear: Tympanic membrane normal.  Left Ear: Tympanic membrane normal.  Nose: Rhinorrhea present.  Mouth/Throat: Uvula is midline, oropharynx is clear and moist and mucous membranes are normal.  Eyes: EOM are normal.  Neck: Neck supple.  Cardiovascular: Normal rate and regular rhythm.   Brisk radial pulse of RUE.  Pulmonary/Chest: Effort normal and breath sounds normal.  Abdominal: Soft. There is no tenderness.  Musculoskeletal: Normal range of motion. She exhibits tenderness.  BB sized, raised, tender area to the radial aspect of left wrist.  Full ROM of left wrist.full range of motion of the shoulder.   Neurological: She is alert and oriented to person, place, and time. No cranial nerve deficit.  Skin: Skin is warm and dry.  Psychiatric: She has a normal mood and affect. Her behavior is normal.  Nursing note and vitals reviewed.   ED Course  Procedures (including critical care time) DIAGNOSTIC STUDIES: Oxygen Saturation is 99% on RA, normal by my interpretation.   COORDINATION OF CARE: 9:59 PM- Will X-Ray left wrist. Pt verbalizes understanding and agrees to plan.  Medications - No data to display  Labs Review Labs Reviewed - No data to display  Imaging Review Dg Wrist Complete Left  12/24/2015  CLINICAL DATA:  Fall days prior in yard, now with small painful lump about the radial wrist. EXAM: LEFT WRIST - COMPLETE 3+ VIEW COMPARISON:  Radiographs 04/22/2014 FINDINGS: There is no evidence of fracture or dislocation. There is no evidence of arthropathy or other focal bone abnormality. Soft tissues are unremarkable. No focal soft tissue abnormality. IMPRESSION: Negative radiographs of the left wrist. Electronically Signed   By: Rubye Oaks M.D.   On: 12/24/2015 23:10    MDM   Final diagnoses:  Left wrist pain  URI (upper respiratory infection)    Patient X-Ray negative for obvious fracture or dislocation.  Pt advised to follow up with orthopedics. Patient given wrist splint while in ED, conservative therapy recommended and discussed. Discussed with the patient possible ganglion cyst of the left wrist. Patient will be discharged home & is agreeable with above plan. Returns precautions discussed. Pt appears safe for discharge.  Will treat URI with Zyrtec.   I personally performed the services described in this documentation, which was scribed in my presence. The recorded information has been reviewed and is accurate.     [address] [place], [place] 12/26/15 1734  Bethann Berkshire, MD 12/27/15 1320

## 2015-12-24 NOTE — Discharge Instructions (Signed)
Wear your wrist splint and follow up with Dr. Izora Ribasoley if symptoms persist.

## 2015-12-24 NOTE — ED Notes (Signed)
Pt. fell at her backyard 2 days ago , reports pain at left wrist and left shoulder . Pt. added nasal congestion .

## 2015-12-24 NOTE — ED Notes (Signed)
Pt transported to xray 

## 2015-12-24 NOTE — ED Notes (Signed)
Pt back from x-ray.

## 2015-12-25 NOTE — ED Notes (Signed)
Patient able to ambulate independently  

## 2016-08-14 ENCOUNTER — Encounter (HOSPITAL_COMMUNITY): Payer: Self-pay | Admitting: *Deleted

## 2016-08-14 ENCOUNTER — Ambulatory Visit (HOSPITAL_COMMUNITY)
Admission: EM | Admit: 2016-08-14 | Discharge: 2016-08-14 | Disposition: A | Discharge status: Home / Self Care | Payer: Managed Care, Other (non HMO) | Attending: Internal Medicine | Admitting: Internal Medicine

## 2016-08-14 DIAGNOSIS — R51 Headache: Secondary | ICD-10-CM | POA: Diagnosis not present

## 2016-08-14 DIAGNOSIS — M67439 Ganglion, unspecified wrist: Secondary | ICD-10-CM

## 2016-08-14 DIAGNOSIS — R519 Headache, unspecified: Secondary | ICD-10-CM

## 2016-08-14 MED ORDER — ONDANSETRON HCL 4 MG/2ML IJ SOLN
INTRAMUSCULAR | Status: AC
  Filled 2016-08-14: qty 2

## 2016-08-14 MED ORDER — IBUPROFEN 800 MG PO TABS: 800.0000 mg | ORAL_TABLET | Freq: Three times a day (TID) | ORAL | 0 refills | Status: DC | PRN

## 2016-08-14 MED ORDER — DIPHENHYDRAMINE HCL 50 MG/ML IJ SOLN
50.0000 mg | Freq: Once | INTRAMUSCULAR | Status: AC
  Administered 2016-08-14: 50 mg via INTRAMUSCULAR

## 2016-08-14 MED ORDER — KETOROLAC TROMETHAMINE 30 MG/ML IJ SOLN
INTRAMUSCULAR | Status: AC
  Filled 2016-08-14: qty 1

## 2016-08-14 MED ORDER — DIPHENHYDRAMINE HCL 50 MG/ML IJ SOLN
INTRAMUSCULAR | Status: AC
  Filled 2016-08-14: qty 1

## 2016-08-14 MED ORDER — DEXAMETHASONE SODIUM PHOSPHATE 10 MG/ML IJ SOLN
10.0000 mg | Freq: Once | INTRAMUSCULAR | Status: AC
  Administered 2016-08-14: 10 mg via INTRAMUSCULAR

## 2016-08-14 MED ORDER — ONDANSETRON HCL 4 MG/2ML IJ SOLN
4.0000 mg | Freq: Once | INTRAMUSCULAR | Status: AC
  Administered 2016-08-14: 4 mg via INTRAMUSCULAR

## 2016-08-14 MED ORDER — DEXAMETHASONE SODIUM PHOSPHATE 10 MG/ML IJ SOLN
INTRAMUSCULAR | Status: AC
  Filled 2016-08-14: qty 1

## 2016-08-14 MED ORDER — KETOROLAC TROMETHAMINE 30 MG/ML IJ SOLN
30.0000 mg | Freq: Once | INTRAMUSCULAR | Status: AC
  Administered 2016-08-14: 30 mg via INTRAMUSCULAR

## 2016-08-14 NOTE — ED Triage Notes (Signed)
C/O lump to left anterior wrist x "months", with enlargement over past week.  Now having pain radiating up into LUE and neck.  Also c/o left-sided HA x 3 wks; has been constant over past 2 wks.  Denies fevers at home.  Has been taking Aleve and Tyl.

## 2016-08-14 NOTE — ED Provider Notes (Signed)
CSN: [id number]     Arrival date & time 08/14/16  1234 History   First MD Initiated Contact with Patient 08/14/16 1402     Chief Complaint  Patient presents with  . Mass  . Headache   (Consider location/radiation/quality/duration/timing/severity/associated sxs/prior Treatment) 41 y.o. female presents with  A cyst to left lateral wrist X 6 months. Patient states that has grown in size recently and caused pain to her index and ring finer. Patient states that she was preciosity referred to a hand surgeon but her work lost the referral. . Condition is chronic  in nature. Condition is made better by noth8ign. Condition is made worse by nothing. Patient denies any treatment  prior to there arrival at this facility.   Patient also reports a left sided headache  Behind her left eye that radiates the back of her head. intermittently X 3 weeks. Pt describes it as light sensitive with auras.        Past Medical History:  Diagnosis Date  . Asthma   . Renal disorder    ? infection; has resolved per pt  . Seasonal allergies    History reviewed. No pertinent surgical history. No family history on file. Social History  Substance Use Topics  . Smoking status: Current Every Day Smoker    Packs/day: 0.00    Types: Cigarettes  . Smokeless tobacco: Never Used  . Alcohol use Yes     Comment: socially   OB History    No data available     Review of Systems  Allergies  Penicillins  Home Medications   Prior to Admission medications   Medication Sig Start Date End Date Taking? Authorizing Provider  BIOTIN PO Take 1 tablet by mouth daily.   Yes Historical Provider, MD  calcium carbonate (OS-CAL) 600 MG TABS tablet Take 600 mg by mouth 2 (two) times daily with a meal.   Yes Historical Provider, MD  Multiple Vitamin (MULTIVITAMIN WITH MINERALS) TABS tablet Take 1 tablet by mouth daily.   Yes Historical Provider, MD  Specialty Vitamins Products (ECHINACEA C COMPLETE PO) Take 1 tablet by mouth  daily.   Yes Historical Provider, MD  cetirizine (ZYRTEC) 10 MG chewable tablet Chew 1 tablet (10 mg total) by mouth daily. 12/24/15   Hope Orlene OchM Neese, NP  naproxen (NAPROSYN) 375 MG tablet Take 1 tablet (375 mg total) by mouth 2 (two) times daily. 12/24/15   Hope Orlene OchM Neese, NP  predniSONE (DELTASONE) 50 MG tablet Take 1 tablet (50 mg total) by mouth daily. 09/21/15   Tharon AquasFrank C Patrick, PA   Meds Ordered and Administered this Visit  Medications - No data to display  BP 118/91   Pulse 75   Temp 99.5 F (37.5 C) (Oral)   Resp 16   LMP 08/03/2016 (Approximate)   SpO2 100%  No data found.   Physical Exam  Urgent Care Course     Procedures (including critical care time)  Labs Review Labs Reviewed - No data to display  Imaging Review No results found.     MDM  No diagnosis found.    Alene MiresJennifer C Omohundro, NP 08/14/16 1502

## 2016-08-19 ENCOUNTER — Other Ambulatory Visit: Payer: Self-pay | Admitting: Orthopedic Surgery

## 2016-08-30 ENCOUNTER — Encounter (HOSPITAL_BASED_OUTPATIENT_CLINIC_OR_DEPARTMENT_OTHER): Payer: Self-pay | Admitting: *Deleted

## 2016-09-02 ENCOUNTER — Encounter (HOSPITAL_BASED_OUTPATIENT_CLINIC_OR_DEPARTMENT_OTHER)
Admission: RE | Disposition: A | Discharge status: Home / Self Care | Payer: Self-pay | Source: Ambulatory Visit | Attending: Orthopedic Surgery

## 2016-09-02 ENCOUNTER — Encounter (HOSPITAL_BASED_OUTPATIENT_CLINIC_OR_DEPARTMENT_OTHER): Payer: Self-pay | Admitting: Anesthesiology

## 2016-09-02 ENCOUNTER — Ambulatory Visit (HOSPITAL_BASED_OUTPATIENT_CLINIC_OR_DEPARTMENT_OTHER): Payer: Managed Care, Other (non HMO) | Admitting: Anesthesiology

## 2016-09-02 ENCOUNTER — Ambulatory Visit (HOSPITAL_BASED_OUTPATIENT_CLINIC_OR_DEPARTMENT_OTHER)
Admission: RE | Admit: 2016-09-02 | Discharge: 2016-09-02 | Disposition: A | Discharge status: Home / Self Care | Payer: Managed Care, Other (non HMO) | Source: Ambulatory Visit | Attending: Orthopedic Surgery | Admitting: Orthopedic Surgery

## 2016-09-02 DIAGNOSIS — Y93K1 Activity, walking an animal: Secondary | ICD-10-CM | POA: Insufficient documentation

## 2016-09-02 DIAGNOSIS — G43909 Migraine, unspecified, not intractable, without status migrainosus: Secondary | ICD-10-CM | POA: Diagnosis not present

## 2016-09-02 DIAGNOSIS — W010XXA Fall on same level from slipping, tripping and stumbling without subsequent striking against object, initial encounter: Secondary | ICD-10-CM | POA: Insufficient documentation

## 2016-09-02 DIAGNOSIS — M67432 Ganglion, left wrist: Secondary | ICD-10-CM | POA: Diagnosis present

## 2016-09-02 DIAGNOSIS — Z880 Allergy status to penicillin: Secondary | ICD-10-CM | POA: Insufficient documentation

## 2016-09-02 DIAGNOSIS — F17210 Nicotine dependence, cigarettes, uncomplicated: Secondary | ICD-10-CM | POA: Diagnosis not present

## 2016-09-02 DIAGNOSIS — Z91040 Latex allergy status: Secondary | ICD-10-CM | POA: Diagnosis not present

## 2016-09-02 HISTORY — DX: Headache, unspecified: R51.9

## 2016-09-02 HISTORY — PX: MASS EXCISION: SHX2000

## 2016-09-02 HISTORY — DX: Headache: R51

## 2016-09-02 SURGERY — EXCISION MASS
Anesthesia: Regional | Site: Hand | Laterality: Left

## 2016-09-02 MED ORDER — FENTANYL CITRATE (PF) 100 MCG/2ML IJ SOLN
INTRAMUSCULAR | Status: AC
  Filled 2016-09-02: qty 2

## 2016-09-02 MED ORDER — DIPHENHYDRAMINE HCL 50 MG/ML IJ SOLN
25.0000 mg | Freq: Once | INTRAMUSCULAR | Status: AC
  Administered 2016-09-02: 25 mg via INTRAVENOUS

## 2016-09-02 MED ORDER — BUPIVACAINE HCL (PF) 0.25 % IJ SOLN
INTRAMUSCULAR | Status: DC | PRN
  Administered 2016-09-02: 5 mL

## 2016-09-02 MED ORDER — TRAMADOL HCL 50 MG PO TABS: 50.0000 mg | ORAL_TABLET | Freq: Four times a day (QID) | ORAL | 0 refills | Status: DC | PRN

## 2016-09-02 MED ORDER — CHLORHEXIDINE GLUCONATE 4 % EX LIQD: 60.0000 mL | Freq: Once | CUTANEOUS | Status: DC

## 2016-09-02 MED ORDER — DIPHENHYDRAMINE HCL 50 MG/ML IJ SOLN
INTRAMUSCULAR | Status: AC
  Filled 2016-09-02: qty 1

## 2016-09-02 MED ORDER — VANCOMYCIN HCL IN DEXTROSE 1-5 GM/200ML-% IV SOLN
1000.0000 mg | INTRAVENOUS | Status: AC
  Administered 2016-09-02: 1000 mg via INTRAVENOUS

## 2016-09-02 MED ORDER — SCOPOLAMINE 1 MG/3DAYS TD PT72: 1.0000 | MEDICATED_PATCH | Freq: Once | TRANSDERMAL | Status: DC | PRN

## 2016-09-02 MED ORDER — MIDAZOLAM HCL 2 MG/2ML IJ SOLN
INTRAMUSCULAR | Status: AC
  Filled 2016-09-02: qty 2

## 2016-09-02 MED ORDER — MIDAZOLAM HCL 2 MG/2ML IJ SOLN
1.0000 mg | INTRAMUSCULAR | Status: DC | PRN
  Administered 2016-09-02: 2 mg via INTRAVENOUS

## 2016-09-02 MED ORDER — PROPOFOL 10 MG/ML IV BOLUS
INTRAVENOUS | Status: DC | PRN
  Administered 2016-09-02 (×3): 20 mg via INTRAVENOUS

## 2016-09-02 MED ORDER — LACTATED RINGERS IV SOLN
INTRAVENOUS | Status: DC
  Administered 2016-09-02: 11:00:00 via INTRAVENOUS

## 2016-09-02 MED ORDER — ONDANSETRON HCL 4 MG/2ML IJ SOLN
INTRAMUSCULAR | Status: DC | PRN
  Administered 2016-09-02: 4 mg via INTRAVENOUS

## 2016-09-02 MED ORDER — VANCOMYCIN HCL IN DEXTROSE 1-5 GM/200ML-% IV SOLN
INTRAVENOUS | Status: AC
  Filled 2016-09-02: qty 200

## 2016-09-02 MED ORDER — FENTANYL CITRATE (PF) 100 MCG/2ML IJ SOLN
50.0000 ug | INTRAMUSCULAR | Status: DC | PRN
  Administered 2016-09-02: 100 ug via INTRAVENOUS

## 2016-09-02 MED ORDER — PROPOFOL 10 MG/ML IV BOLUS
INTRAVENOUS | Status: AC
  Filled 2016-09-02: qty 20

## 2016-09-02 SURGICAL SUPPLY — 40 items
BANDAGE COBAN STERILE 2 (GAUZE/BANDAGES/DRESSINGS) IMPLANT
BLADE SURG 15 STRL LF DISP TIS (BLADE) ×1 IMPLANT
BLADE SURG 15 STRL SS (BLADE) ×2
BNDG COHESIVE 1X5 TAN STRL LF (GAUZE/BANDAGES/DRESSINGS) ×3 IMPLANT
BNDG COHESIVE 3X5 TAN STRL LF (GAUZE/BANDAGES/DRESSINGS) ×3 IMPLANT
BNDG ESMARK 4X9 LF (GAUZE/BANDAGES/DRESSINGS) ×3 IMPLANT
BNDG GAUZE ELAST 4 BULKY (GAUZE/BANDAGES/DRESSINGS) IMPLANT
CHLORAPREP W/TINT 26ML (MISCELLANEOUS) ×3 IMPLANT
CORDS BIPOLAR (ELECTRODE) ×3 IMPLANT
COVER BACK TABLE 60X90IN (DRAPES) ×3 IMPLANT
COVER MAYO STAND STRL (DRAPES) ×3 IMPLANT
CUFF TOURNIQUET SINGLE 18IN (TOURNIQUET CUFF) ×3 IMPLANT
DECANTER SPIKE VIAL GLASS SM (MISCELLANEOUS) IMPLANT
DRAIN PENROSE 1/2X12 LTX STRL (WOUND CARE) IMPLANT
DRAPE EXTREMITY T 121X128X90 (DRAPE) ×3 IMPLANT
DRAPE SURG 17X23 STRL (DRAPES) ×3 IMPLANT
GAUZE SPONGE 4X4 12PLY STRL (GAUZE/BANDAGES/DRESSINGS) ×3 IMPLANT
GAUZE XEROFORM 1X8 LF (GAUZE/BANDAGES/DRESSINGS) ×3 IMPLANT
GLOVE BIOGEL PI IND STRL 8.5 (GLOVE) ×1 IMPLANT
GLOVE BIOGEL PI INDICATOR 8.5 (GLOVE) ×2
GLOVE SURG ORTHO 8.0 STRL STRW (GLOVE) ×3 IMPLANT
GOWN STRL REUS W/ TWL LRG LVL3 (GOWN DISPOSABLE) ×1 IMPLANT
GOWN STRL REUS W/TWL LRG LVL3 (GOWN DISPOSABLE) ×2
GOWN STRL REUS W/TWL XL LVL3 (GOWN DISPOSABLE) ×3 IMPLANT
NDL SAFETY ECLIPSE 18X1.5 (NEEDLE) IMPLANT
NEEDLE HYPO 18GX1.5 SHARP (NEEDLE)
NEEDLE PRECISIONGLIDE 27X1.5 (NEEDLE) ×3 IMPLANT
NS IRRIG 1000ML POUR BTL (IV SOLUTION) ×3 IMPLANT
PACK BASIN DAY SURGERY FS (CUSTOM PROCEDURE TRAY) ×3 IMPLANT
PAD CAST 3X4 CTTN HI CHSV (CAST SUPPLIES) IMPLANT
PADDING CAST COTTON 3X4 STRL (CAST SUPPLIES)
SPLINT PLASTER CAST XFAST 3X15 (CAST SUPPLIES) IMPLANT
SPLINT PLASTER XTRA FASTSET 3X (CAST SUPPLIES)
STOCKINETTE 4X48 STRL (DRAPES) ×3 IMPLANT
SUT ETHILON 4 0 PS 2 18 (SUTURE) ×3 IMPLANT
SUT VIC AB 4-0 P2 18 (SUTURE) IMPLANT
SYR BULB 3OZ (MISCELLANEOUS) ×3 IMPLANT
SYR CONTROL 10ML LL (SYRINGE) ×3 IMPLANT
TOWEL OR 17X24 6PK STRL BLUE (TOWEL DISPOSABLE) ×3 IMPLANT
UNDERPAD 30X30 (UNDERPADS AND DIAPERS) ×3 IMPLANT

## 2016-09-02 NOTE — Anesthesia Procedure Notes (Signed)
Anesthesia Regional Block: Bier block (IV Regional)   Pre-Anesthetic Checklist: ,, timeout performed, Correct Patient, Correct Site, Correct Laterality, Correct Procedure,, site marked, surgical consent,, at surgeon's request Needles:  Injection technique: Single-shot  Needle Type: Other      Needle Gauge: 20     Additional Needles:   Procedures:,,,,,,, Esmarch exsanguination, double tourniquet utilized  Narrative:  Start time: 09/02/2016 11:31 AM End time: 09/02/2016 11:31 AM Injection made incrementally with aspirations every 45 mL.  Performed by: Personally

## 2016-09-02 NOTE — Op Note (Signed)
[address] [address]:  Burns, Beverly            ACCOUNT NO.:  [id number]  MEDICAL RECORD NO.:  [id number]  LOCATION:                                 FACILITY:  PHYSICIAN:  Cindee SaltGary Chantale Leugers, M.D.            DATE OF BIRTH:  DATE OF PROCEDURE:  09/02/2016 DATE OF DISCHARGE:                              OPERATIVE REPORT   PREOPERATIVE DIAGNOSIS:  Volar radial wrist ganglion, left wrist.  POSTOPERATIVE DIAGNOSIS:  Volar radial wrist ganglion, left wrist.  OPERATION:  Excision volar radial wrist ganglion, left wrist.  SURGEON:  Cindee SaltGary Kenlee Maler, MD.  ANESTHESIA:  Upper arm IV regional with local infiltration.  [facility]  HISTORY:  The patient is a 41 year old female with a large volar radial wrist ganglion.  She is desirous having this excised.  Pre, peri, and postoperative course were discussed along with risks and complications. She is aware that there is no guarantee to the surgery, the possibility of infection; recurrence of injury to arteries, nerves, tendons; incomplete relief of symptoms and dystrophy.  In the preoperative area, the patient was seen, the extremity marked by both patient and surgeon. Antibiotic given.  PROCEDURE IN DETAIL:  The patient was brought to the operating room, where an upper arm IV regional anesthetic was carried out without difficulty under the direction of the Anesthesia Department.  She was prepped using ChloraPrep in a supine position with the left arm free.  A 3-minute dry time was allowed and time-out taken, confirming the patient and procedure.  A volar incision was made over the mass, carried down through subcutaneous tissue.  Bleeders were electrocauterized.  A cyst was immediately encountered.  With blunt and sharp dissection, this was dissected free from the surrounding radial artery and venae comitantes. With blunt and sharp and sharp dissection, this was awfully dissected and the stalk followed down to the radiocarpal joint.   This area was opened, debrided.  The joint was opened, debrided with a rongeur.  The wound was copiously irrigated with saline.  The capsule entrance was then closed with figure-of-eight 4-0 Vicryl sutures.  The subcutaneous tissue was closed with interrupted 4-0 Vicryl and the skin with interrupted 4-0 nylon sutures.  A local infiltration with 0.25% bupivacaine without epinephrine was given, approximately 6 mL was used. A sterile compressive dressing and volar splint were applied.  On deflation of the tourniquet, all fingers immediately pinked.  She was taken to the recovery room for observation in satisfactory condition.  She will be discharged to home [facility] [place] [facility] [place], on [facility].          ______________________________ Cindee SaltGary Gibran Veselka, M.D.     GK/MEDQ  D:  09/02/2016  T:  09/02/2016  Job:  [id number]
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
## 2016-09-02 NOTE — Op Note (Signed)
Dictation Number [id number]

## 2016-09-02 NOTE — Anesthesia Postprocedure Evaluation (Signed)
Anesthesia Post Note  Patient: [name]  Procedure(s) Performed: Procedure(s) (LRB): EXCISION LEFT GANGLION VOLAR CYST ON WRIST (Left)  Patient location during evaluation: PACU Anesthesia Type: Bier Block Level of consciousness: awake and alert Pain management: pain level controlled Vital Signs Assessment: post-procedure vital signs reviewed and stable Respiratory status: spontaneous breathing Cardiovascular status: stable Anesthetic complications: no       Last Vitals:  Vitals:   09/02/16 1230 09/02/16 1250  BP: 138/90 132/90  Pulse: 61 82  Resp: 12 16  Temp:  36.4 C    Last Pain:  Vitals:   09/02/16 1250  TempSrc: Oral  PainSc:                  Lewie LoronJohn Kiano Terrien

## 2016-09-02 NOTE — Discharge Instructions (Addendum)

## 2016-09-02 NOTE — Transfer of Care (Signed)
Immediate Anesthesia Transfer of Care Note  Patient: [name]  Procedure(s) Performed: Procedure(s): EXCISION LEFT GANGLION VOLAR CYST ON WRIST (Left)  Patient Location: PACU  Anesthesia Type:MAC and Bier block  Level of Consciousness: awake, alert  and oriented  Airway & Oxygen Therapy: Patient Spontanous Breathing and Patient connected to face mask oxygen  Post-op Assessment: Report given to RN and Post -op Vital signs reviewed and stable  Post vital signs: Reviewed and stable  Last Vitals:  Vitals:   09/02/16 1110  BP: 108/69  Pulse: 86  Resp: 16  Temp: 37 C    Last Pain:  Vitals:   09/02/16 1110  TempSrc: Oral         Complications: No apparent anesthesia complications

## 2016-09-02 NOTE — Anesthesia Preprocedure Evaluation (Signed)
Anesthesia Evaluation  Patient identified by MRN, date of birth, ID band Patient awake    Reviewed: Allergy & Precautions, NPO status , Patient's Chart, lab work & pertinent test results  Airway Mallampati: II  TM Distance: >3 FB Neck ROM: Full    Dental no notable dental hx.    Pulmonary asthma , Current Smoker,    Pulmonary exam normal breath sounds clear to auscultation       Cardiovascular negative cardio ROS Normal cardiovascular exam Rhythm:Regular Rate:Normal     Neuro/Psych  Headaches, negative psych ROS   GI/Hepatic negative GI ROS, Neg liver ROS,   Endo/Other  negative endocrine ROS  Renal/GU negative Renal ROS     Musculoskeletal negative musculoskeletal ROS (+)   Abdominal   Peds  Hematology negative hematology ROS (+)   Anesthesia Other Findings   Reproductive/Obstetrics negative OB ROS                             Anesthesia Physical Anesthesia Plan  ASA: II  Anesthesia Plan: Bier Block   Post-op Pain Management:    Induction: Intravenous  Airway Management Planned:   Additional Equipment:   Intra-op Plan:   Post-operative Plan:   Informed Consent: I have reviewed the patients History and Physical, chart, labs and discussed the procedure including the risks, benefits and alternatives for the proposed anesthesia with the patient or authorized representative who has indicated his/her understanding and acceptance.   Dental advisory given  Plan Discussed with: CRNA  Anesthesia Plan Comments:         Anesthesia Quick Evaluation

## 2016-09-02 NOTE — Brief Op Note (Signed)
09/02/2016  12:04 PM  PATIENT:  [name]  41 y.o. female  PRE-OPERATIVE DIAGNOSIS:  left volar ganglion cyst  POST-OPERATIVE DIAGNOSIS:  left volar ganglion cyst  PROCEDURE:  Procedure(s): EXCISION LEFT GANGLION VOLAR CYST ON WRIST (Left)  SURGEON:  Surgeon(s) and Role:    * Cindee SaltGary Lebert Lovern, MD - Primary  PHYSICIAN ASSISTANT:   ASSISTANTS: none   ANESTHESIA:   local and regional  EBL:  Total I/O In: -  Out: 5 [Blood:5]  BLOOD ADMINISTERED:none  DRAINS: none   LOCAL MEDICATIONS USED:  BUPIVICAINE   SPECIMEN:  Excision  DISPOSITION OF SPECIMEN:  PATHOLOGY  COUNTS:  YES  TOURNIQUET:   Total Tourniquet Time Documented: Upper Arm (Left) - 31 minutes Total: Upper Arm (Left) - 31 minutes   DICTATION: .Other Dictation: Dictation Number [id number]  PLAN OF CARE: Discharge to home after PACU  PATIENT DISPOSITION:  PACU - hemodynamically stable.

## 2016-09-06 ENCOUNTER — Encounter (HOSPITAL_BASED_OUTPATIENT_CLINIC_OR_DEPARTMENT_OTHER): Payer: Self-pay | Admitting: Orthopedic Surgery

## 2016-11-15 ENCOUNTER — Ambulatory Visit (HOSPITAL_COMMUNITY)
Admission: EM | Admit: 2016-11-15 | Discharge: 2016-11-15 | Disposition: A | Discharge status: Home / Self Care | Payer: Managed Care, Other (non HMO) | Attending: Internal Medicine | Admitting: Internal Medicine

## 2016-11-15 ENCOUNTER — Encounter (HOSPITAL_COMMUNITY): Payer: Self-pay | Admitting: Emergency Medicine

## 2016-11-15 DIAGNOSIS — M25511 Pain in right shoulder: Secondary | ICD-10-CM

## 2016-11-15 DIAGNOSIS — S46911A Strain of unspecified muscle, fascia and tendon at shoulder and upper arm level, right arm, initial encounter: Secondary | ICD-10-CM

## 2016-11-15 MED ORDER — NAPROXEN 500 MG PO TABS: 500.0000 mg | ORAL_TABLET | Freq: Two times a day (BID) | ORAL | 0 refills | Status: DC

## 2016-11-15 NOTE — ED Triage Notes (Signed)
[facility] a [facility] right shoulder pain that started this am. The patient denied any known injury.

## 2016-11-15 NOTE — ED Provider Notes (Signed)
CSN: [id number]     Arrival date & time 11/15/16  1041 History   First MD Initiated Contact with Patient 11/15/16 1142     Chief Complaint  Patient presents with  . Shoulder Pain   (Consider location/radiation/quality/duration/timing/severity/associated sxs/prior Treatment) Patient c/o right shoulder pain since last night when she went to get out of bed and felt pain in her right shoulder.   The history is provided by the patient.  Shoulder Pain  Location:  Shoulder Shoulder location:  R shoulder Injury: no   Pain details:    Quality:  Aching   Radiates to:  Does not radiate   Severity:  Moderate   Onset quality:  Sudden   Duration:  1 day   Timing:  Constant   Progression:  Worsening Handedness:  Right-handed Dislocation: no   Foreign body present:  No foreign bodies Tetanus status:  Unknown   Past Medical History:  Diagnosis Date  . Asthma    as teenager  . Headache    migraines  . Renal disorder    ? infection; has resolved per pt  . Seasonal allergies    Past Surgical History:  Procedure Laterality Date  . MASS EXCISION Left 09/02/2016   Procedure: EXCISION LEFT GANGLION VOLAR CYST ON WRIST;  Surgeon: Cindee Salt, MD;  Location:  SURGERY CENTER;  Service: Orthopedics;  Laterality: Left;   History reviewed. No pertinent family history. Social History  Substance Use Topics  . Smoking status: Current Every Day Smoker    Packs/day: 0.25    Types: Cigarettes  . Smokeless tobacco: Never Used     Comment: Trying to quit  . Alcohol use Yes     Comment: socially   OB History    No data available     Review of Systems  Constitutional: Negative.   HENT: Negative.   Eyes: Negative.   Respiratory: Negative.   Cardiovascular: Negative.   Gastrointestinal: Negative.   Endocrine: Negative.   Genitourinary: Negative.   Musculoskeletal: Negative.   Allergic/Immunologic: Negative.   Neurological: Negative.   Hematological: Negative.    Psychiatric/Behavioral: Negative.     Allergies  Penicillins and Latex  Home Medications   Prior to Admission medications   Medication Sig Start Date End Date Taking? Authorizing Provider  naproxen (NAPROSYN) 500 MG tablet Take 1 tablet (500 mg total) by mouth 2 (two) times daily with a meal. 11/15/16   Deonte Otting, Anselm Pancoast, FNP   Meds Ordered and Administered this Visit  Medications - No data to display  BP 96/70 (BP Location: Left Arm)   Pulse 81   Temp 98.6 F (37 C) (Oral)   Resp 16   SpO2 99%  No data found.   Physical Exam  Constitutional: She appears well-developed and well-nourished.  HENT:  Head: Normocephalic and atraumatic.  Eyes: Conjunctivae and EOM are normal. Pupils are equal, round, and reactive to light.  Neck: Normal range of motion.  Cardiovascular: Normal rate, regular rhythm and normal heart sounds.   Pulmonary/Chest: Effort normal and breath sounds normal.  Musculoskeletal: She exhibits tenderness.  Tenderness with internal/ external rotation and abduction right shoulder.  Nursing note and vitals reviewed.   Urgent Care Course     Procedures (including critical care time)  Labs Review Labs Reviewed - No data to display  Imaging Review No results found.   Visual Acuity Review  Right Eye Distance:   Left Eye Distance:   Bilateral Distance:    Right Eye Near:  Left Eye Near:    Bilateral Near:         MDM   1. Acute pain of right shoulder   2. Strain of right shoulder, initial encounter    Naprosyn 500mg  one po bid x 10 days  Work note      Deatra CanterOxford, Reade Trefz J, [place]NP 11/15/16 1222

## 2016-11-22 ENCOUNTER — Ambulatory Visit (HOSPITAL_COMMUNITY)
Admission: EM | Admit: 2016-11-22 | Discharge: 2016-11-22 | Disposition: A | Discharge status: Home / Self Care | Payer: Managed Care, Other (non HMO) | Attending: Family Medicine | Admitting: Family Medicine

## 2016-11-22 ENCOUNTER — Encounter (HOSPITAL_COMMUNITY): Payer: Self-pay | Admitting: Emergency Medicine

## 2016-11-22 DIAGNOSIS — M778 Other enthesopathies, not elsewhere classified: Secondary | ICD-10-CM | POA: Diagnosis not present

## 2016-11-22 DIAGNOSIS — M25532 Pain in left wrist: Secondary | ICD-10-CM

## 2016-11-22 MED ORDER — NAPROXEN 500 MG PO TABS: 500.0000 mg | ORAL_TABLET | Freq: Two times a day (BID) | ORAL | 0 refills | Status: DC

## 2016-11-22 NOTE — ED Provider Notes (Signed)
CSN: [id number]     Arrival date & time 11/22/16  1629 History     Chief Complaint  Patient presents with  . Hand Pain    41 yo female with PMH of left ganglion volar cyst s/p excision 09/02/2016 comes in with a few day history of increase pain of wrist. She states pain around surgical site has not been pain free post surgery, but had been getting better until recently. She has experienced some increased warmth and pain of the radial side of her wrist with mild relief on Naproxen 500mg  BID and ice/heat compress. Her job requires repetitive movement of both hand and wrist, and today she her third and fourth finger locked up in flexion while at work. 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 She currently wears a wrist brace as instructed by her orthopedic surgeon. Denies fever, infection of surgical site, numbness and tingling.       Past Medical History:  Diagnosis Date  . Asthma    as teenager  . Headache    migraines  . Renal disorder    ? infection; has resolved per pt  . Seasonal allergies    Past Surgical History:  Procedure Laterality Date  . MASS EXCISION Left 09/02/2016   Procedure: EXCISION LEFT GANGLION VOLAR CYST ON WRIST;  Surgeon: Cindee Salt, MD;  Location: [facility];  Service: Orthopedics;  Laterality: Left;   History reviewed. No pertinent family history. Social History  Substance Use Topics  . Smoking status: Current Every Day Smoker    Packs/day: 0.25    Types: Cigarettes  . Smokeless tobacco: Never Used     Comment: Trying to quit  . Alcohol use Yes     Comment: socially   OB History    No data available     Review of Systems  Constitutional: Negative for chills and fever.  Respiratory: Negative for shortness of breath.   Cardiovascular: Negative for chest pain and palpitations.  Musculoskeletal: Positive for arthralgias, joint  swelling and myalgias.  Skin: Negative for rash and wound.    Allergies  Penicillins and Latex  Home Medications   Prior to Admission medications   Medication Sig Start Date End Date Taking? Authorizing Provider  naproxen (NAPROSYN) 500 MG tablet Take 1 tablet (500 mg total) by mouth 2 (two) times daily with a meal. 11/22/16   Cathie Hoops, Yehuda Printup V, PA-C   Meds Ordered and Administered this Visit  Medications - No data to display  BP 99/60 (BP Location: Right Arm)   Pulse 92   Temp 98.6 F (37 C) (Oral)   Resp 20   LMP 11/01/2016   SpO2 98%  No data found.   Physical Exam  Constitutional: She is oriented to person, place, and time. She appears well-developed and well-nourished. No distress.  HENT:  Head: Normocephalic and atraumatic.  Eyes: Conjunctivae are normal. Pupils are equal, round, and reactive to light.  Cardiovascular: Normal rate and regular rhythm.   Pulmonary/Chest: Effort normal and breath sounds normal.  Musculoskeletal:       Right wrist: Normal.       Left wrist: She exhibits normal range of motion, no bony tenderness, no swelling and no effusion.  Surgical site on flexor surface of radial wrist. Well healed, no increased warmth, erythema open wound seen. Tenderness on palpation of the surgical site down to mid forearm. Full sensation.  Full ROM of fingers.   Neurological: She is alert and oriented to person, place, and time.  Skin: Skin is warm and dry.  Psychiatric: She has a normal mood and affect. Her behavior is normal. Judgment normal.    Urgent Care Course     Procedures (including critical care time)  Labs Review Labs Reviewed - No data to display  Imaging Review No results found.       MDM   1. Left wrist pain   2. Tendonitis of wrist, left    1. Discussed with patient left wrist pain most likely due to tendonitis from repetitive motion. Discussed treatment including NSAID, ice/heat compress, and rest. Patient stated she is not able to miss  work financially, and that her work does not accommodate for restrictions. Discussed tendonitis may continue, reoccur without proper rest. Patient expressed understanding. Continue Naproxen 500mg  BID x 7 days. Continue ice/heat compress. Rest wrist as often as she can. Continue with wrist brace and provided splint material/buddy tape of her third and fourth fingers to restrict complete flexion of the fingers to relieve some stress during work. 2. Patient to call orthopedic surgeon and notify them of recent events. Patient has follow up appointment with them this month.    Belinda FisherYu, Yariah Selvey V, PA-C 11/22/16 1815

## 2016-11-22 NOTE — ED Triage Notes (Signed)
Pt c/o left hand pain onset 2 days... 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 NAD... Ambulatory

## 2016-12-30 ENCOUNTER — Encounter (HOSPITAL_COMMUNITY): Payer: Self-pay

## 2016-12-30 ENCOUNTER — Emergency Department (HOSPITAL_COMMUNITY)
Admission: EM | Admit: 2016-12-30 | Discharge: 2016-12-30 | Disposition: A | Discharge status: Home / Self Care | Payer: Managed Care, Other (non HMO) | Attending: Emergency Medicine | Admitting: Emergency Medicine

## 2016-12-30 DIAGNOSIS — J45909 Unspecified asthma, uncomplicated: Secondary | ICD-10-CM | POA: Insufficient documentation

## 2016-12-30 DIAGNOSIS — Z91040 Latex allergy status: Secondary | ICD-10-CM | POA: Diagnosis not present

## 2016-12-30 DIAGNOSIS — W540XXA Bitten by dog, initial encounter: Secondary | ICD-10-CM | POA: Diagnosis not present

## 2016-12-30 DIAGNOSIS — Y939 Activity, unspecified: Secondary | ICD-10-CM | POA: Insufficient documentation

## 2016-12-30 DIAGNOSIS — Y929 Unspecified place or not applicable: Secondary | ICD-10-CM | POA: Insufficient documentation

## 2016-12-30 DIAGNOSIS — S60411A Abrasion of left index finger, initial encounter: Secondary | ICD-10-CM | POA: Insufficient documentation

## 2016-12-30 DIAGNOSIS — Z23 Encounter for immunization: Secondary | ICD-10-CM | POA: Diagnosis not present

## 2016-12-30 DIAGNOSIS — F17210 Nicotine dependence, cigarettes, uncomplicated: Secondary | ICD-10-CM | POA: Insufficient documentation

## 2016-12-30 DIAGNOSIS — Y998 Other external cause status: Secondary | ICD-10-CM | POA: Diagnosis not present

## 2016-12-30 MED ORDER — TETANUS-DIPHTH-ACELL PERTUSSIS 5-2.5-18.5 LF-MCG/0.5 IM SUSP
0.5000 mL | Freq: Once | INTRAMUSCULAR | Status: AC
  Administered 2016-12-30: 0.5 mL via INTRAMUSCULAR
  Filled 2016-12-30: qty 0.5

## 2016-12-30 NOTE — ED Provider Notes (Signed)
[facility] Provider Note   CSN: [id number] Arrival date & time: 12/30/16  1318     History   Chief Complaint Chief Complaint  Patient presents with  . Animal Bite    HPI [name] is a 41 y.o. female who presents to the emergency department with a chief complaint of dog bite. She reports that she was bitten by a dog at approximately 11 AM this morning. She states that the dog was recently found in the neighborhood, but was brought home by her family recently. Today she  The dog out of its cage, and it bit her left index finger. No injuries to other fingers. She reports that she has not called animal control yet and is unsure of the dog's vaccination status. She reports that her tetanus is not up-to-date. No treatment prior to arrival. No other complaints at this time. She reports that she is currently taking 300 mg of clindamycin every 8 hours for 30 days, which was prescribed buy her dentist.  The history is provided by the patient. No language interpreter was used.    Past Medical History:  Diagnosis Date  . Asthma    as teenager  . Headache    migraines  . Renal disorder    ? infection; has resolved per pt  . Seasonal allergies     There are no active problems to display for this patient.   Past Surgical History:  Procedure Laterality Date  . MASS EXCISION Left 09/02/2016   Procedure: EXCISION LEFT GANGLION VOLAR CYST ON WRIST;  Surgeon: Cindee SaltGary Kuzma, MD;  Location: [facility];  Service: Orthopedics;  Laterality: Left;    OB History    No data available       Home Medications    Prior to Admission medications   Medication Sig Start Date End Date Taking? Authorizing Provider  naproxen (NAPROSYN) 500 MG tablet Take 1 tablet (500 mg total) by mouth 2 (two) times daily with a meal. 11/22/16   Belinda FisherYu, Amy V, PA-C    Family History No family history on file.  Social History Social History  Substance Use Topics  . Smoking status: Current  Every Day Smoker    Packs/day: 0.25    Types: Cigarettes  . Smokeless tobacco: Never Used     Comment: Trying to quit  . Alcohol use Yes     Comment: socially     Allergies   Penicillins and Latex   Review of Systems Review of Systems  Skin: Positive for wound.  Neurological: Negative for weakness.     Physical Exam Updated Vital Signs BP 110/79   Pulse 100   Temp 99.9 F (37.7 C) (Oral)   Resp 19   Ht 5' 2.5" (1.588 m)   Wt 59.9 kg (132 lb)   LMP 12/05/2016 (Approximate)   SpO2 100%   BMI 23.76 kg/m   Physical Exam  Constitutional: She is oriented to person, place, and time. She appears well-developed and well-nourished. No distress.  HENT:  Head: Normocephalic and atraumatic.  Eyes: Conjunctivae are normal.  Neck: Normal range of motion. Neck supple.  Cardiovascular: Normal rate and regular rhythm.  Exam reveals no gallop and no friction rub.   No murmur heard. Pulmonary/Chest: Effort normal. No respiratory distress.  Abdominal: Soft. She exhibits no distension.  Musculoskeletal: Normal range of motion.  Neurological: She is alert and oriented to person, place, and time.  Skin: Skin is warm. No rash noted.  There are 20.5 cm  superficial abrasions that are hemostatic to the left index finger extending from the inferior portion of the nailbed towards the DIP. No surrounding ecchymosis, erythema, or warmth. Sensation is intact. Good strength of the digit against resistance. 5 out of 5 grip strength. Radial pulses 2+.  Psychiatric: Her behavior is normal.  Nursing note and vitals reviewed.    ED Treatments / Results  Labs (all labs ordered are listed, but only abnormal results are displayed) Labs Reviewed - No data to display  EKG  EKG Interpretation None       Radiology No results found.  Procedures Procedures (including critical care time)  Medications Ordered in ED Medications  Tdap (BOOSTRIX) injection 0.5 mL (0.5 mLs Intramuscular Given  12/30/16 1519)     Initial Impression / Assessment and Plan / ED Course  I have reviewed the triage vital signs and the nursing notes.  Pertinent labs & imaging results that were available during my care of the patient were reviewed by me and considered in my medical decision making (see chart for details).     Patient presents with two superficial abrasions to the left index finger from a dog bite.  Pt wounds irrigated well with 18ga angiocath with sterile saline and betadine.  Wounds examined with visualization of the base and no foreign bodies seen.  Pt Alert and oriented, NAD, nontoxic, nonseptic appearing.  Capillary refill intact and pt without neurologic deficit.  Due to the superficial depth of the wound, imaging is not indicated at this time.. Patient tetanus updated.  Patient rabies vaccine and immunoglobulin risk and benefit discussed.  Encourage the patient to follow-up with animal control to lower and the vaccination status of the dog. The patient is a party taking 3 mg of clindamycin every 8 hours as prescribed by her dentist for 30 days. Given her penicillin allergy, and no additional antibiotics are indicated at this time. Strict return precautions given for wound check. No acute distress. The patient is safe and stable for discharge at this time.  Final Clinical Impressions(s) / ED Diagnoses   Final diagnoses:  Dog bite, initial encounter    New Prescriptions New Prescriptions   No medications on file     Barkley BoardsMcDonald, Trinetta Alemu A, PA-C 12/30/16 1615    Mabe, Latanya MaudlinMartha L, MD 12/30/16 1615

## 2016-12-30 NOTE — ED Triage Notes (Signed)
Per Pt, Pt is coming from home with complaints of dog biting her left index finger. The dog is new to her family and she was letting it out of the cage when it bit her finger.

## 2016-12-30 NOTE — Discharge Instructions (Signed)
Please continue to take your Clindamycin as prescribed by your dentist. It is very important to make sure you reach out to animal control to ensure the dog's vaccination status is verified as soon as possible.   If you develop new or worsening symptoms, including swelling, heat, or redness to the finger, please return to the emergency department for reevaluation.

## 2017-03-15 ENCOUNTER — Encounter (HOSPITAL_COMMUNITY): Payer: Self-pay

## 2017-03-15 ENCOUNTER — Encounter (HOSPITAL_COMMUNITY): Payer: Self-pay | Admitting: Emergency Medicine

## 2017-03-15 ENCOUNTER — Ambulatory Visit (HOSPITAL_COMMUNITY)
Admission: EM | Admit: 2017-03-15 | Discharge: 2017-03-15 | Disposition: A | Discharge status: Home / Self Care | Payer: Managed Care, Other (non HMO) | Attending: Family Medicine | Admitting: Family Medicine

## 2017-03-15 ENCOUNTER — Emergency Department (HOSPITAL_COMMUNITY): Payer: Managed Care, Other (non HMO)

## 2017-03-15 ENCOUNTER — Emergency Department (HOSPITAL_COMMUNITY)
Admission: EM | Admit: 2017-03-15 | Discharge: 2017-03-15 | Disposition: A | Discharge status: Home / Self Care | Payer: Managed Care, Other (non HMO) | Attending: Emergency Medicine | Admitting: Emergency Medicine

## 2017-03-15 DIAGNOSIS — S29011A Strain of muscle and tendon of front wall of thorax, initial encounter: Secondary | ICD-10-CM | POA: Diagnosis not present

## 2017-03-15 DIAGNOSIS — Z5321 Procedure and treatment not carried out due to patient leaving prior to being seen by health care provider: Secondary | ICD-10-CM | POA: Insufficient documentation

## 2017-03-15 DIAGNOSIS — R079 Chest pain, unspecified: Secondary | ICD-10-CM | POA: Diagnosis present

## 2017-03-15 DIAGNOSIS — R0789 Other chest pain: Secondary | ICD-10-CM | POA: Diagnosis not present

## 2017-03-15 DIAGNOSIS — T148XXA Other injury of unspecified body region, initial encounter: Secondary | ICD-10-CM

## 2017-03-15 LAB — BASIC METABOLIC PANEL
ANION GAP: 10 (ref 5–15)
BUN: 14 mg/dL (ref 6–20)
CO2: 22 mmol/L (ref 22–32)
Calcium: 8.6 mg/dL — ABNORMAL LOW (ref 8.9–10.3)
Chloride: 106 mmol/L (ref 101–111)
Creatinine, Ser: 0.68 mg/dL (ref 0.44–1.00)
GFR calc Af Amer: >60 mL/min (ref >60)
GLUCOSE: 89 mg/dL (ref 65–99)
POTASSIUM: 3.1 mmol/L — AB (ref 3.5–5.1)
Sodium: 138 mmol/L (ref 135–145)

## 2017-03-15 LAB — CBC
HEMATOCRIT: 37.9 % (ref 36.0–46.0)
HEMOGLOBIN: 13.3 g/dL (ref 12.0–15.0)
MCH: 32.1 pg (ref 26.0–34.0)
MCHC: 35.1 g/dL (ref 30.0–36.0)
MCV: 91.5 fL (ref 78.0–100.0)
Platelets: 240 10*3/uL (ref 150–400)
RBC: 4.14 MIL/uL (ref 3.87–5.11)
RDW: 13.3 % (ref 11.5–15.5)
WBC: 8.6 10*3/uL (ref 4.0–10.5)

## 2017-03-15 LAB — I-STAT TROPONIN, ED: Troponin i, poc: 0 ng/mL (ref 0.00–0.08)

## 2017-03-15 NOTE — ED Provider Notes (Signed)
[facility]    CSN: [id number] Arrival date & time: 03/15/17  1255     History   Chief Complaint Chief Complaint  Patient presents with  . Chest Pain    HPI [name] is a 41 y.o. female.   41 year old female states that about a week ago she experienced pain to the left mid chest that radiates across the left costal margin into the left back. She states her job requires repetitive work leaning over pulling and moving objects left and right. This exacerbates the pain. Taking deep breath and coughing exacerbates the pain. Denies shortness of breath. No blunt trauma.      Past Medical History:  Diagnosis Date  . Asthma    as teenager  . Headache    migraines  . Renal disorder    ? infection; has resolved per pt  . Seasonal allergies     There are no active problems to display for this patient.   Past Surgical History:  Procedure Laterality Date  . MASS EXCISION Left 09/02/2016   Procedure: EXCISION LEFT GANGLION VOLAR CYST ON WRIST;  Surgeon: Cindee Salt, MD;  Location: [facility];  Service: Orthopedics;  Laterality: Left;    OB History    No data available       Home Medications    Prior to Admission medications   Medication Sig Start Date End Date Taking? Authorizing Provider  naproxen (NAPROSYN) 500 MG tablet Take 1 tablet (500 mg total) by mouth 2 (two) times daily with a meal. 11/22/16   Belinda Fisher, PA-C    Family History No family history on file.  Social History Social History  Substance Use Topics  . Smoking status: Current Every Day Smoker    Packs/day: 0.25    Types: Cigarettes  . Smokeless tobacco: Never Used     Comment: Trying to quit  . Alcohol use Yes     Comment: socially     Allergies   Penicillins and Latex   Review of Systems Review of Systems  Constitutional: Negative.  Negative for activity change, chills and fever.  HENT: Negative.   Respiratory: Negative.  Negative for cough, choking,  shortness of breath and wheezing.   Cardiovascular: Negative for palpitations.  Gastrointestinal: Negative.   Musculoskeletal:       As per HPI  Skin: Negative for color change, pallor and rash.  Neurological: Negative.   All other systems reviewed and are negative.    Physical Exam Triage Vital Signs ED Triage Vitals  Enc Vitals Group     BP 03/15/17 1317 107/69     Pulse Rate 03/15/17 1317 80     Resp 03/15/17 1317 16     Temp 03/15/17 1317 98.7 F (37.1 C)     Temp Source 03/15/17 1317 Oral     SpO2 03/15/17 1317 100 %     Weight 03/15/17 1319 128 lb (58.1 kg)     Height 03/15/17 1319  (1.575 m)     Head Circumference --      Peak Flow --      Pain Score 03/15/17 1319 7     Pain Loc --      Pain Edu? --      Excl. in GC? --    No data found.   Updated Vital Signs BP 107/69 (BP Location: Left Arm)   Pulse 80   Temp 98.7 F (37.1 C) (Oral)   Resp 16   Ht   (1.575 m)   Wt 128 lb (58.1 kg)   SpO2 100%   BMI 23.41 kg/m   Visual Acuity Right Eye Distance:   Left Eye Distance:   Bilateral Distance:    Right Eye Near:   Left Eye Near:    Bilateral Near:     Physical Exam  Constitutional: She is oriented to person, place, and time. She appears well-developed and well-nourished. No distress.  HENT:  Head: Normocephalic and atraumatic.  Eyes: Pupils are equal, round, and reactive to light. EOM are normal.  Neck: Normal range of motion. Neck supple.  Cardiovascular: Normal rate, regular rhythm, normal heart sounds and intact distal pulses.   Pulmonary/Chest: Effort normal and breath sounds normal. No respiratory distress. She has no wheezes. She exhibits tenderness.  Unequivocal mid left parasternal border tenderness as well tenderness along the left costal margin. Mild tenderness to the left low back at the posterior axillary line. The patient again takes a deep breath and coughs precipitating increased pain. Having the patient and perform a butterfly  movement as well as bilateral arm pushed downward against resistance reproduces pain in the left back and left anterior chest.  Lymphadenopathy:    She has no cervical adenopathy.  Neurological: She is alert and oriented to person, place, and time. No cranial nerve deficit.  Skin: Skin is warm and dry.     UC Treatments / Results  Labs (all labs ordered are listed, but only abnormal results are displayed) Labs Reviewed - No data to display  EKG  EKG Interpretation None       Radiology Dg Chest 2 View  Result Date: 03/15/2017 CLINICAL DATA:  Sharp pleuritic chest pain for 1 week, after moving heavy object. EXAM: CHEST  2 VIEW COMPARISON:  12/06/2007 FINDINGS: The lungs are clear. The pulmonary vasculature is normal. Heart size is normal. Hilar and mediastinal contours are unremarkable. There is no pleural effusion. IMPRESSION: No active cardiopulmonary disease. Electronically Signed   By: Ellery Plunk M.D.   On: 03/15/2017 02:32    Procedures Procedures (including critical care time)  Medications Ordered in UC Medications - No data to display   Initial Impression / Assessment and Plan / UC Course  I have reviewed the triage vital signs and the nursing notes.  Pertinent labs & imaging results that were available during my care of the patient were reviewed by me and considered in my medical decision making (see chart for details).    Ice to the sore areas for the next 2-3 days. Then go to heat. Limit activity that tends to make the pain worse May take ibuprofen 600 mg every 6-8 hours as needed or Aleve. This type of pain in the ribs tend to last for a few days to weeks     Final Clinical Impressions(s) / UC Diagnoses   Final diagnoses:  Chest wall pain  Intercostal muscle strain, initial encounter  Muscle strain    New Prescriptions New Prescriptions   No medications on file     Controlled Substance Prescriptions [place] Controlled Substance Registry consulted?  Not Applicable   Hayden Rasmussen, NP 03/15/17 1334

## 2017-03-15 NOTE — Discharge Instructions (Signed)
Ice to the sore areas for the next 2-3 days. Then go to heat. Limit activity that tends to make the pain worse May take ibuprofen 600 mg every 6-8 hours as needed or Aleve. This type of pain in the ribs tend to last for a few days to weeks

## 2017-03-15 NOTE — ED Notes (Signed)
Pt called to be roomed with no anwser

## 2017-03-15 NOTE — ED Triage Notes (Signed)
Pt states that last week she was moving a fridge and began to feel pain in her chest that radiates under her L breast into her back, worse when taking a deep breath, denies [id number]

## 2017-03-15 NOTE — ED Triage Notes (Signed)
PT reports she was moving something heavy and believes she pulled a muscle beneath left breast. Pain is reproducable and worse with movement. PT was triaged in the ER lat night, but did not stay to see a doctor.

## 2017-04-16 ENCOUNTER — Encounter (HOSPITAL_COMMUNITY): Payer: Self-pay | Admitting: Emergency Medicine

## 2017-04-16 ENCOUNTER — Ambulatory Visit (HOSPITAL_COMMUNITY)
Admission: EM | Admit: 2017-04-16 | Discharge: 2017-04-16 | Disposition: A | Discharge status: Home / Self Care | Payer: Managed Care, Other (non HMO) | Attending: Radiology | Admitting: Radiology

## 2017-04-16 DIAGNOSIS — J069 Acute upper respiratory infection, unspecified: Secondary | ICD-10-CM

## 2017-04-16 MED ORDER — LEVOFLOXACIN 750 MG PO TABS
750.0000 mg | ORAL_TABLET | Freq: Every day | ORAL | 0 refills | Status: DC
Start: 2017-04-16 — End: 2020-11-25

## 2017-04-16 MED ORDER — FLUTICASONE PROPIONATE 50 MCG/ACT NA SUSP: 1.0000 | Freq: Every day | NASAL | 2 refills | Status: DC

## 2017-04-16 NOTE — Discharge Instructions (Signed)
Continue to push fluids and take over the counter medications as directed on the back of the box for symptomatic relief.  ° °

## 2017-04-16 NOTE — ED Provider Notes (Signed)
[facility]    CSN: [id number] Arrival date & time: 04/16/17  1250     History   Chief Complaint Chief Complaint  Patient presents with  . Generalized Body Aches  . URI    HPI Angelina SheriffLatayvia Lotito is a 41 y.o. female.   41 y.o. female presents with URI symptoms including congestion, headache, sinus pressure, subjective fever  and sore throat by 3 days Condition is acute  in nature. Condition is made better by nothing. Condition is made worse by noth8ing. Patient denies any relief from OTC tylenol cold and flu but cannot take medication while workling prior to there arrival at this facility. Patient denies any nausea vomitting or diarrhea.       Past Medical History:  Diagnosis Date  . Asthma    as teenager  . Headache    migraines  . Renal disorder    ? infection; has resolved per pt  . Seasonal allergies     There are no active problems to display for this patient.   History reviewed. No pertinent surgical history.  OB History    No data available       Home Medications    Prior to Admission medications   Medication Sig Start Date End Date Taking? Authorizing Provider  naproxen (NAPROSYN) 500 MG tablet Take 1 tablet (500 mg total) by mouth 2 (two) times daily with a meal. 11/22/16   Belinda FisherYu, Amy V, PA-C    Family History History reviewed. No pertinent family history.  Social History Social History   Tobacco Use  . Smoking status: Current Every Day Smoker    Packs/day: 0.25    Types: Cigarettes  . Smokeless tobacco: Never Used  . Tobacco comment: Trying to quit  Substance Use Topics  . Alcohol use: Yes    Comment: socially  . Drug use: No     Allergies   Penicillins and Latex   Review of Systems Review of Systems  Constitutional: Positive for fever. Negative for chills.  HENT: Positive for congestion, sinus pain and sore throat. Negative for ear pain.   Eyes: Negative for pain and visual disturbance.  Respiratory: Negative for  cough and shortness of breath.   Cardiovascular: Negative for chest pain and palpitations.  Gastrointestinal: Negative for abdominal pain and vomiting.  Genitourinary: Negative for dysuria and hematuria.  Musculoskeletal: Negative for arthralgias and back pain.  Skin: Negative for color change and rash.  Neurological: Positive for headaches. Negative for seizures and syncope.  All other systems reviewed and are negative.    Physical Exam Triage Vital Signs ED Triage Vitals [04/16/17 1333]  Enc Vitals Group     BP 106/71     Pulse Rate 78     Resp 18     Temp 99 F (37.2 C)     Temp Source Oral     SpO2 100 %     Weight      Height      Head Circumference      Peak Flow      Pain Score 5     Pain Loc      Pain Edu?      Excl. in GC?    No data found.  Updated Vital Signs BP 106/71 (BP Location: Left Arm)   Pulse 78   Temp 99 F (37.2 C) (Oral)   Resp 18   SpO2 100%       Physical Exam  Constitutional: She is oriented to person,  place, and time. She appears well-developed and well-nourished.  HENT:  Head: Normocephalic and atraumatic.  Nasal congestion. Erythema noted to throat with post nasal drip. Pain with palpation to frontal sinuses and ethmoid.   Eyes: Conjunctivae are normal.  Neck: Normal range of motion.  Pulmonary/Chest: Effort normal.  Musculoskeletal: Normal range of motion.  Neurological: She is alert and oriented to person, place, and time.  Skin: Skin is warm.  Psychiatric: She has a normal mood and affect.  Nursing note and vitals reviewed.    UC Treatments / Results  Labs (all labs ordered are listed, but only abnormal results are displayed) Labs Reviewed - No data to display  EKG  EKG Interpretation None       Radiology No results found.  Procedures Procedures (including critical care time)  Medications Ordered in UC Medications - No data to display   Initial Impression / Assessment and Plan / UC Course  I have reviewed  the triage vital signs and the nursing notes.  Pertinent labs & imaging results that were available during my care of the patient were reviewed by me and considered in my medical decision making (see chart for details).       Final Clinical Impressions(s) / UC Diagnoses   Final diagnoses:  None    ED Discharge Orders    None       Controlled Substance Prescriptions [place] Controlled Substance Registry consulted? Not Applicable   Alene MiresOmohundro, Artur Winningham C, NP 04/16/17 1445

## 2017-04-16 NOTE — ED Triage Notes (Signed)
Pt here with URI sx and body aches x 3 days  

## 2017-11-23 ENCOUNTER — Encounter (HOSPITAL_COMMUNITY): Payer: Self-pay | Admitting: Emergency Medicine

## 2017-11-23 ENCOUNTER — Other Ambulatory Visit: Payer: Self-pay

## 2017-11-23 ENCOUNTER — Emergency Department (HOSPITAL_COMMUNITY)
Admission: EM | Admit: 2017-11-23 | Discharge: 2017-11-23 | Disposition: A | Discharge status: Home / Self Care | Payer: Managed Care, Other (non HMO) | Attending: Emergency Medicine | Admitting: Emergency Medicine

## 2017-11-23 DIAGNOSIS — R51 Headache: Secondary | ICD-10-CM | POA: Insufficient documentation

## 2017-11-23 DIAGNOSIS — Z5321 Procedure and treatment not carried out due to patient leaving prior to being seen by health care provider: Secondary | ICD-10-CM | POA: Insufficient documentation

## 2017-11-23 NOTE — ED Notes (Signed)
Follow up call made  11/23/17 0925 s [place] Bash[place] rn

## 2017-11-23 NOTE — ED Notes (Signed)
Pt called from lobby x1 No response 

## 2017-11-23 NOTE — ED Triage Notes (Signed)
Pt w/ hx of migraines c/o occipital headache x 2 days.  No other symptoms.

## 2017-11-23 NOTE — ED Notes (Signed)
Pt called x 2 and 3 no response.

## 2019-03-28 ENCOUNTER — Other Ambulatory Visit: Payer: Self-pay

## 2019-03-28 ENCOUNTER — Encounter (HOSPITAL_COMMUNITY): Payer: Self-pay | Admitting: Emergency Medicine

## 2019-03-28 ENCOUNTER — Emergency Department (HOSPITAL_COMMUNITY): Payer: 59

## 2019-03-28 ENCOUNTER — Emergency Department (HOSPITAL_COMMUNITY)
Admission: EM | Admit: 2019-03-28 | Discharge: 2019-03-28 | Disposition: A | Discharge status: Home / Self Care | Payer: 59 | Attending: Emergency Medicine | Admitting: Emergency Medicine

## 2019-03-28 DIAGNOSIS — M25561 Pain in right knee: Secondary | ICD-10-CM | POA: Insufficient documentation

## 2019-03-28 DIAGNOSIS — F17210 Nicotine dependence, cigarettes, uncomplicated: Secondary | ICD-10-CM | POA: Diagnosis not present

## 2019-03-28 DIAGNOSIS — Z79899 Other long term (current) drug therapy: Secondary | ICD-10-CM | POA: Diagnosis not present

## 2019-03-28 DIAGNOSIS — M79604 Pain in right leg: Secondary | ICD-10-CM | POA: Diagnosis not present

## 2019-03-28 DIAGNOSIS — M79671 Pain in right foot: Secondary | ICD-10-CM | POA: Diagnosis not present

## 2019-03-28 DIAGNOSIS — J45909 Unspecified asthma, uncomplicated: Secondary | ICD-10-CM | POA: Insufficient documentation

## 2019-03-28 MED ORDER — NAPROXEN 250 MG PO TABS
500.0000 mg | ORAL_TABLET | Freq: Once | ORAL | Status: AC
  Administered 2019-03-28: 500 mg via ORAL
  Filled 2019-03-28: qty 2

## 2019-03-28 NOTE — ED Provider Notes (Signed)
[facility] EMERGENCY DEPARTMENT Provider Note   CSN: [id number] Arrival date & time: 03/28/19  1913     History   Chief Complaint Chief Complaint  Patient presents with  . Foot Pain    HPI [name] is a 43 y.o. female with history of migraine headaches, seasonal allergies, asthma presenting for evaluation of acute onset, progressively worsening right lower extremity pain for 4 days.  Reports symptoms began suddenly on Sunday along the dorsum of the right foot and have since began to spread up the right lower extremity, now up to the knee.  Notes pain is sharp and stabbing, worsens with rest and with weightbearing at times.  Has tried Tylenol with some relief.  Denies numbness or tingling.  No fevers.  No significant swelling.  No recent travel or surgeries, no prior history of DVT or PE.     The history is provided by the patient.    Past Medical History:  Diagnosis Date  . Asthma    as teenager  . Headache    migraines  . Renal disorder    ? infection; has resolved per pt  . Seasonal allergies     There are no active problems to display for this patient.   Past Surgical History:  Procedure Laterality Date  . MASS EXCISION Left 09/02/2016   Procedure: EXCISION LEFT GANGLION VOLAR CYST ON WRIST;  Surgeon: Daryll Brod, MD;  Location: [facility];  Service: Orthopedics;  Laterality: Left;     OB History   No obstetric history on file.      Home Medications    Prior to Admission medications   Medication Sig Start Date End Date Taking? Authorizing Provider  fluticasone (FLONASE) 50 MCG/ACT nasal spray Place 1 spray daily into both nostrils. 04/16/17   Jacqualine Mau, NP  levofloxacin (LEVAQUIN) 750 MG tablet Take 1 tablet (750 mg total) daily by mouth. 04/16/17   Jacqualine Mau, NP  naproxen (NAPROSYN) 500 MG tablet Take 1 tablet (500 mg total) by mouth 2 (two) times daily with a meal. 11/22/16   Ok Edwards, PA-C     Family History No family history on file.  Social History Social History   Tobacco Use  . Smoking status: Current Every Day Smoker    Packs/day: 0.25    Types: Cigarettes  . Smokeless tobacco: Never Used  . Tobacco comment: Trying to quit  Substance Use Topics  . Alcohol use: Yes    Comment: socially  . Drug use: No     Allergies   Penicillins and Latex   Review of Systems Review of Systems  Constitutional: Negative for fever.  Respiratory: Negative for shortness of breath.   Cardiovascular: Negative for leg swelling.  Musculoskeletal: Positive for arthralgias.  Skin: Negative for color change.  Neurological: Negative for weakness and numbness.     Physical Exam Updated Vital Signs BP 133/74 (BP Location: Left Arm)   Pulse 87   Temp 98.5 F (36.9 C) (Oral)   Resp 18   Ht 5' 2.5" (1.588 m)   Wt 50.8 kg   LMP 03/26/2019   SpO2 99%   BMI 20.16 kg/m   Physical Exam Vitals signs and nursing note reviewed.  Constitutional:      General: She is not in acute distress.    Appearance: She is well-developed.  HENT:     Head: Normocephalic and atraumatic.  Eyes:     General:  Right eye: No discharge.        Left eye: No discharge.     Conjunctiva/sclera: Conjunctivae normal.  Neck:     Vascular: No JVD.     Trachea: No tracheal deviation.  Cardiovascular:     Rate and Rhythm: Normal rate.     Pulses: Normal pulses.     Comments: 2+ DP/PT pulses bilaterally, Homans' sign present on the right.  No significant swelling of the bilateral lower extremities.  Compartments are soft. Pulmonary:     Effort: Pulmonary effort is normal.  Abdominal:     General: There is no distension.  Musculoskeletal:     Comments: 5/5 strength of BLE major muscle groups.  No midline spine tenderness.  Mild tenderness to palpation of the anterior right lower leg.  No crepitus, deformity, edema, or erythema.  Skin:    General: Skin is warm and dry.     Findings: No  erythema.  Neurological:     Mental Status: She is alert.     Comments: Fluent speech, no facial droop, sensation intact to light touch of bilateral lower extremities, normal gait, and patient able to heel walk and toe walk without difficulty.   Psychiatric:        Behavior: Behavior normal.      ED Treatments / Results  Labs (all labs ordered are listed, but only abnormal results are displayed) Labs Reviewed - No data to display  EKG None  Radiology Dg Tibia/fibula Right  Result Date: 03/28/2019 CLINICAL DATA:  Pain EXAM: RIGHT TIBIA AND FIBULA - 2 VIEW COMPARISON:  None. FINDINGS: There is no evidence of fracture or other focal bone lesions. Soft tissues are unremarkable. IMPRESSION: Negative. Electronically Signed   By: Jonna ClarkBindu  Avutu M.D.   On: 03/28/2019 23:13   Dg Knee Complete 4 Views Right  Result Date: 03/28/2019 CLINICAL DATA:  Pain EXAM: RIGHT KNEE - COMPLETE 4+ VIEW COMPARISON:  None. FINDINGS: No evidence of fracture, dislocation, or joint effusion. No evidence of arthropathy or other focal bone abnormality. Soft tissues are unremarkable. IMPRESSION: Negative. Electronically Signed   By: Jonna ClarkBindu  Avutu M.D.   On: 03/28/2019 23:12   Dg Foot Complete Right  Result Date: 03/28/2019 CLINICAL DATA:  Pain EXAM: RIGHT FOOT COMPLETE - 3+ VIEW COMPARISON:  None. FINDINGS: There is no evidence of fracture or dislocation. There is no evidence of arthropathy or other focal bone abnormality. Soft tissues are unremarkable. IMPRESSION: Negative. Electronically Signed   By: Jonna ClarkBindu  Avutu M.D.   On: 03/28/2019 23:10    Procedures Procedures (including critical care time)  Medications Ordered in ED Medications  naproxen (NAPROSYN) tablet 500 mg (500 mg Oral Given 03/28/19 2340)     Initial Impression / Assessment and Plan / ED Course  I have reviewed the triage vital signs and the nursing notes.  Pertinent labs & imaging results that were available during my care of the patient  were reviewed by me and considered in my medical decision making (see chart for details).        Patient presenting for evaluation of right lower extremity pain that began around the right foot and now extends up to the knee.  No history of trauma.  Patient is afebrile, vital signs are stable.  She is nontoxic in appearance.  She is neurovascularly intact.  No signs of secondary skin infection or septic arthritis.  She has very mild right calf tenderness on palpation.  Normal range of motion of the joints actively and  passively.  No ligamentous laxity.  She is ambulatory without difficulty.  No midline spine tenderness.  Radiographs obtained are negative with no evidence of acute osseous abnormality, subcutaneous gas, or effusion.  I have a low suspicion of DVT overall but given calf tenderness and a sending pain will arrange for DVT study in the morning as vascular techs are not available at this time.  Discussed conservative therapy and management of her symptoms with NSAIDs, Tylenol, ice, heat, gentle stretching.  Discussed strict ED return precautions.  Recommend help with PCP or orthopedist for reevaluation of symptoms. Patient verbalized understanding of and agreement with plan and is safe for discharge home at this time.   Final Clinical Impressions(s) / ED Diagnoses   Final diagnoses:  Acute pain of right lower extremity    ED Discharge Orders         Ordered    [facility]     03/28/19 2325           Jeanie Sewer, PA-C 03/29/19 0024    Mancel Bale, MD 03/31/19 [phone]

## 2019-03-28 NOTE — ED Notes (Signed)
Patient verbalizes understanding of discharge instructions. Opportunity for questioning and answers were provided. Armband removed by staff, pt discharged from ED ambulatory by self\  

## 2019-03-28 NOTE — Discharge Instructions (Signed)
Start taking Naprosyn twice daily with meals for pain.  You can take 1 to 2 tablets of Tylenol every 6 hours as needed for pain as well.  When you are not walking on the leg, elevate and apply ice or heat whichever feels best.  Do some gentle stretching to avoid muscle stiffness.  You have been scheduled for a ultrasound of your right leg in the morning to rule out blood clots.  Follow the attached instructions.  Return to the emergency department if any concerning signs or symptoms develop such as high fevers, severe swelling, worsening pain, loss of feeling or loss of pulses.

## 2019-03-28 NOTE — ED Triage Notes (Signed)
Patient reports right foot pain onset Sunday unrelieved by OTC Tylenol , denies injury/ambulatory.

## 2019-03-29 ENCOUNTER — Ambulatory Visit (HOSPITAL_COMMUNITY)
Admission: RE | Admit: 2019-03-29 | Discharge: 2019-03-29 | Disposition: A | Discharge status: Home / Self Care | Payer: 59 | Source: Ambulatory Visit | Attending: Physician Assistant | Admitting: Physician Assistant

## 2019-03-29 DIAGNOSIS — M79609 Pain in unspecified limb: Secondary | ICD-10-CM

## 2019-03-29 DIAGNOSIS — M79604 Pain in right leg: Secondary | ICD-10-CM | POA: Insufficient documentation

## 2019-03-29 MED ORDER — NAPROXEN 500 MG PO TABS: 500.0000 mg | ORAL_TABLET | Freq: Two times a day (BID) | ORAL | 0 refills | Status: DC

## 2019-05-23 IMAGING — DX DG CHEST 2V
2 series · 2 of 2 positions shown · non-contrast
Comparison: 12/06/2007

CLINICAL DATA: Sharp pleuritic chest pain for 1 week, after moving
heavy object.

EXAM:
CHEST  2 VIEW

[chest pa]
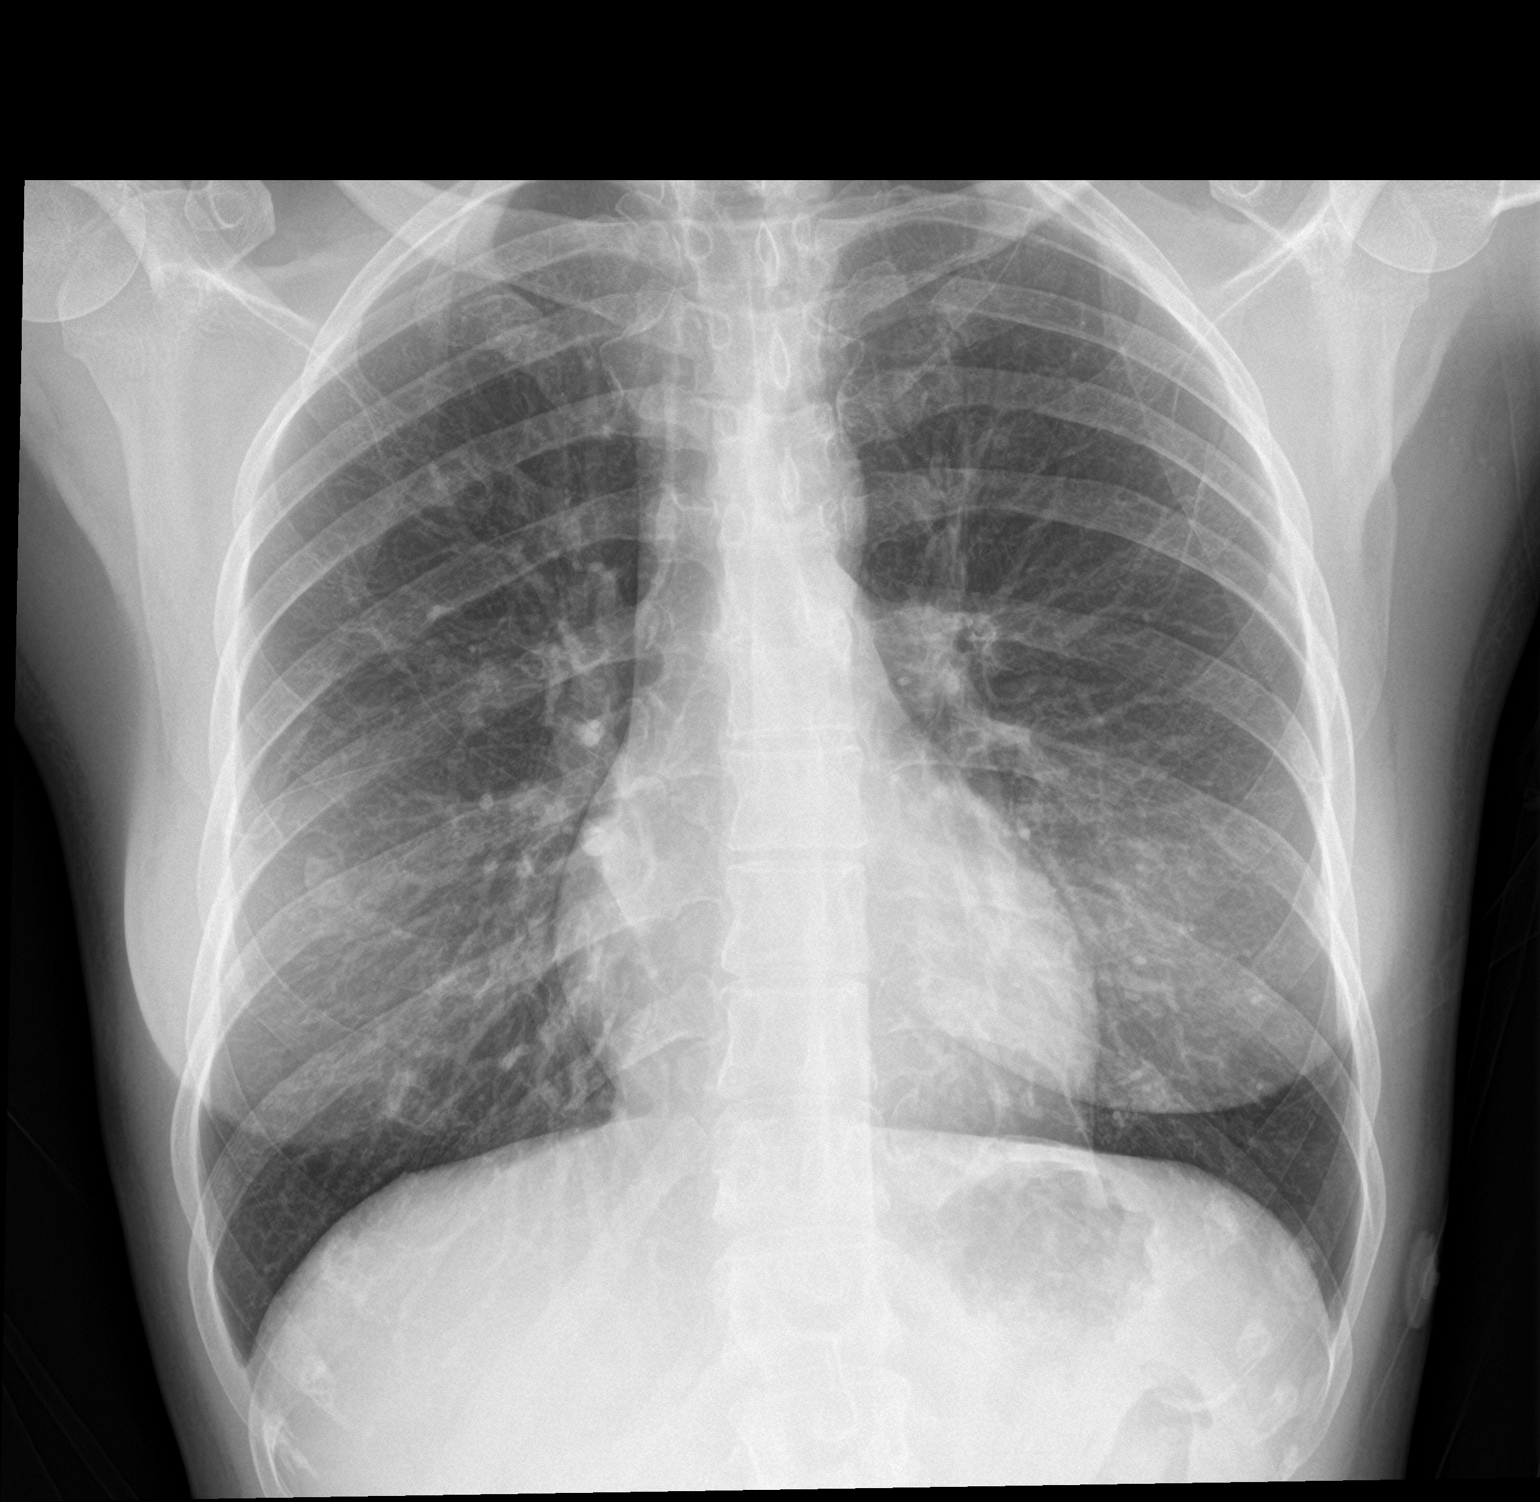

[chest lat]
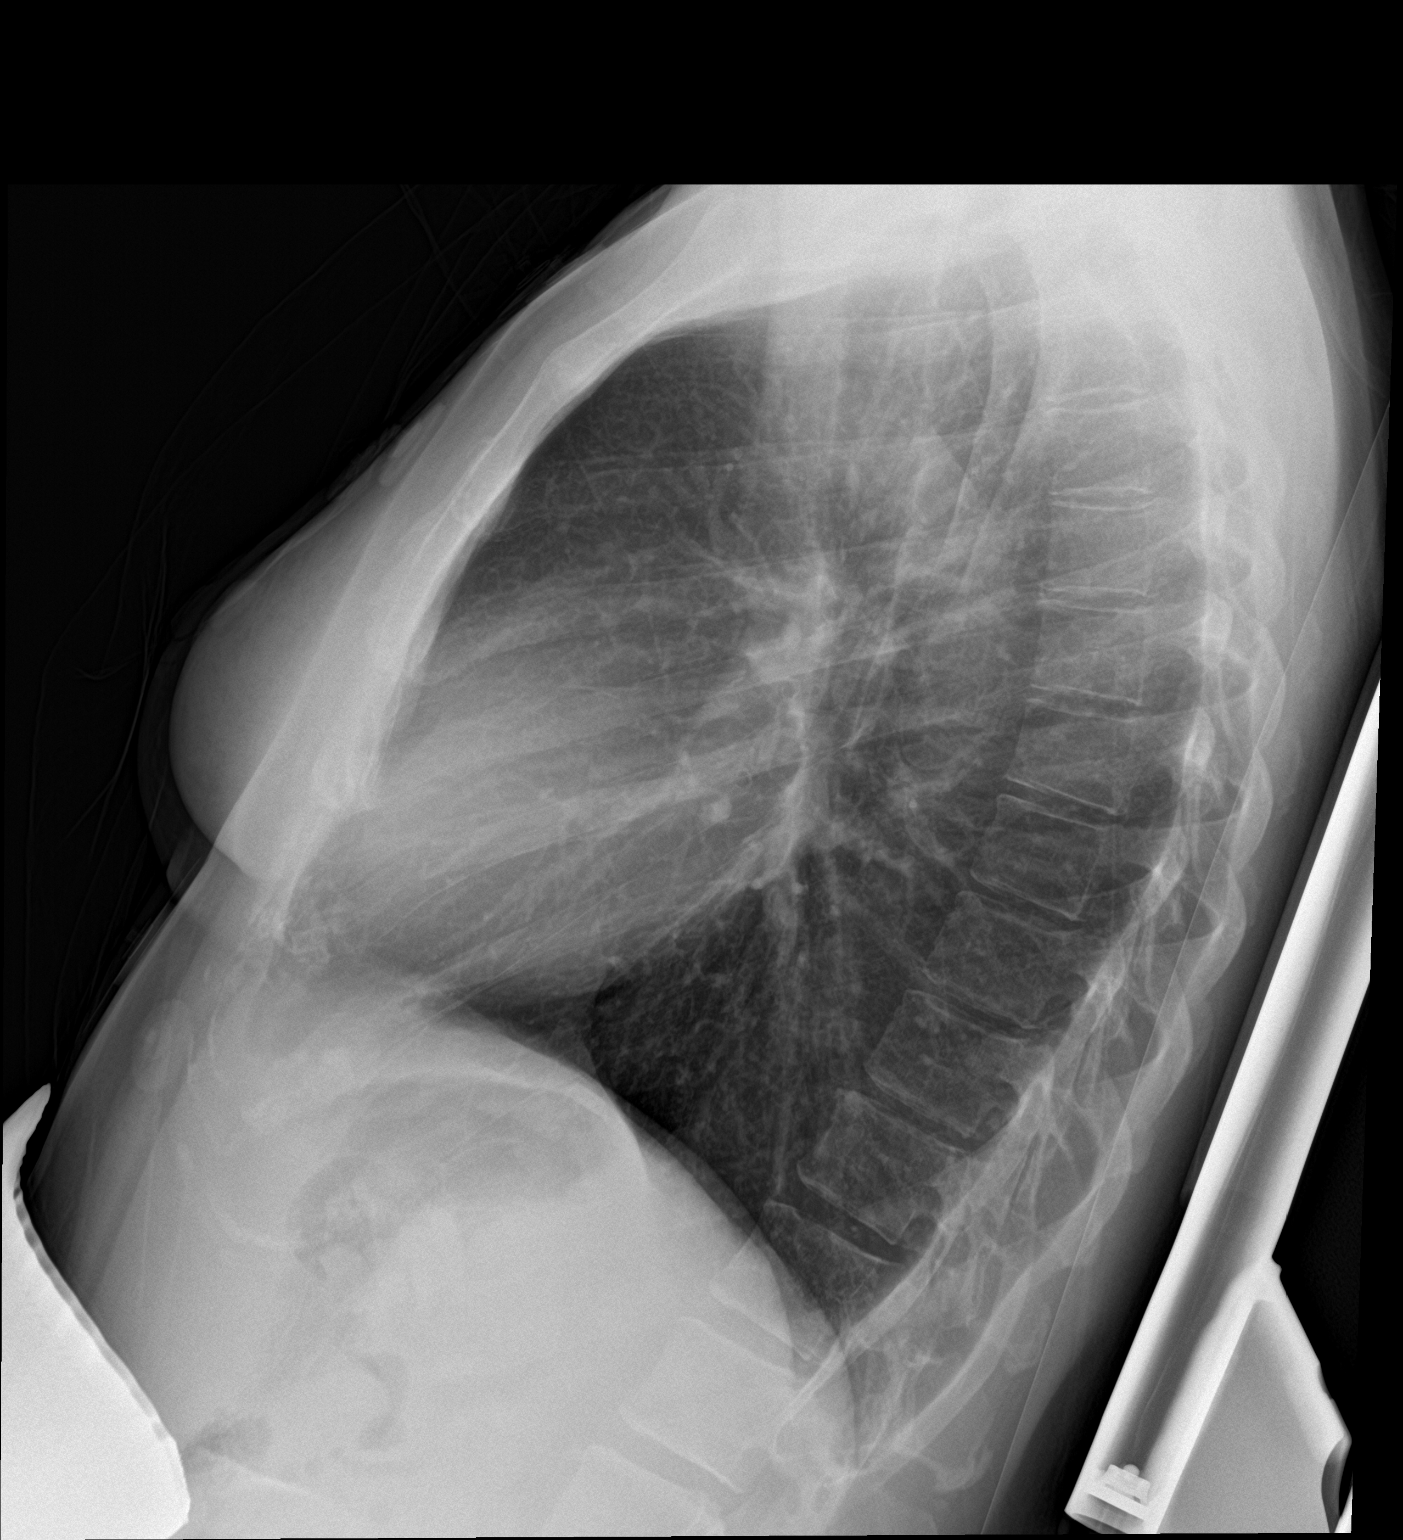

[2 of 2 positions shown; findings below may reference images not displayed]

FINDINGS: The lungs are clear. The pulmonary vasculature is normal. Heart size
is normal. Hilar and mediastinal contours are unremarkable. There is
no pleural effusion.
IMPRESSION: No active cardiopulmonary disease.

## 2019-07-01 ENCOUNTER — Ambulatory Visit (INDEPENDENT_AMBULATORY_CARE_PROVIDER_SITE_OTHER): Payer: Medicaid Other

## 2019-07-01 ENCOUNTER — Encounter (HOSPITAL_COMMUNITY): Payer: Self-pay

## 2019-07-01 ENCOUNTER — Other Ambulatory Visit: Payer: Self-pay

## 2019-07-01 ENCOUNTER — Ambulatory Visit (HOSPITAL_COMMUNITY)
Admission: EM | Admit: 2019-07-01 | Discharge: 2019-07-01 | Disposition: A | Discharge status: Home / Self Care | Payer: Medicaid Other | Attending: Family Medicine | Admitting: Family Medicine

## 2019-07-01 DIAGNOSIS — S40021A Contusion of right upper arm, initial encounter: Secondary | ICD-10-CM

## 2019-07-01 DIAGNOSIS — S5011XA Contusion of right forearm, initial encounter: Secondary | ICD-10-CM

## 2019-07-01 DIAGNOSIS — W231XXA Caught, crushed, jammed, or pinched between stationary objects, initial encounter: Secondary | ICD-10-CM
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
## 2020-09-16 ENCOUNTER — Other Ambulatory Visit: Payer: Self-pay

## 2020-09-16 ENCOUNTER — Ambulatory Visit (HOSPITAL_COMMUNITY)
Admission: EM | Admit: 2020-09-16 | Discharge: 2020-09-16 | Disposition: A | Discharge status: Home / Self Care | Payer: 59 | Attending: Urgent Care | Admitting: Urgent Care

## 2020-09-16 ENCOUNTER — Encounter (HOSPITAL_COMMUNITY): Payer: Self-pay

## 2020-09-16 DIAGNOSIS — S70362A Insect bite (nonvenomous), left thigh, initial encounter: Secondary | ICD-10-CM | POA: Diagnosis not present

## 2020-09-16 DIAGNOSIS — W57XXXA Bitten or stung by nonvenomous insect and other nonvenomous arthropods, initial encounter: Secondary | ICD-10-CM | POA: Diagnosis not present

## 2020-09-16 DIAGNOSIS — L299 Pruritus, unspecified: Secondary | ICD-10-CM | POA: Diagnosis not present

## 2020-09-16 MED ORDER — DOXYCYCLINE HYCLATE 100 MG PO CAPS: 200.0000 mg | ORAL_CAPSULE | Freq: Once | ORAL | 0 refills | Status: AC

## 2020-09-16 MED ORDER — TRIAMCINOLONE ACETONIDE 0.1 % EX CREA: 1.0000 "application " | TOPICAL_CREAM | Freq: Two times a day (BID) | CUTANEOUS | 0 refills | Status: DC

## 2020-09-16 NOTE — ED Provider Notes (Signed)
[facility]   MRN: [id number] DOB: [birth year]  Subjective:   [name] is a 46 y.o. female presenting for wound check on a tick bite that she suffered 3 days ago.  Patient states that it was not there but for 1 to 2 hours.  Felt it over her left thigh.  Removed it and noticed that it was not engorged.  States that the wound has gotten red and is mostly itchy.  Has a history of a terrible experience with a tick bite as a child and had to be hospitalized.  She is very worried that she may have the same occurrence.  Denies fever, headache, eye pain, cough, chest pain, shortness of breath.  She has felt jittery and had some cold chills.  But this has not been persistent and has generally been mild.  No current facility-administered medications for this encounter.  Current Outpatient Medications:  .  fluticasone (FLONASE) 50 MCG/ACT nasal spray, Place 1 spray daily into both nostrils., Disp: 16 g, Rfl: 2 .  levofloxacin (LEVAQUIN) 750 MG tablet, Take 1 tablet (750 mg total) daily by mouth., Disp: 5 tablet, Rfl: 0 .  naproxen (NAPROSYN) 500 MG tablet, Take 1 tablet (500 mg total) by mouth 2 (two) times daily with a meal., Disp: 30 tablet, Rfl: 0   Allergies  Allergen Reactions  . Penicillins Anaphylaxis  . Celery Oil   . Latex Rash    itching    Past Medical History:  Diagnosis Date  . Asthma    as teenager  . Headache    migraines  . Renal disorder    ? infection; has resolved per pt  . Seasonal allergies      Past Surgical History:  Procedure Laterality Date  . MASS EXCISION Left 09/02/2016   Procedure: EXCISION LEFT GANGLION VOLAR CYST ON WRIST;  Surgeon: Cindee Salt, MD;  Location: [facility];  Service: Orthopedics;  Laterality: Left;    Family History  Family history unknown: Yes    Social History   Tobacco Use  . Smoking status: Current Every Day Smoker    Packs/day: 0.25    Types: Cigarettes  . Smokeless tobacco: Never Used   . Tobacco comment: Trying to quit  Substance Use Topics  . Alcohol use: Yes    Comment: socially  . Drug use: No    ROS   Objective:   Vitals: BP 111/73 (BP Location: Left Arm)   Pulse 78   Temp 98.5 F (36.9 C) (Oral)   Resp 18   LMP 09/15/2020 (Exact Date)   SpO2 100%   Physical Exam Constitutional:      General: She is not in acute distress.    Appearance: Normal appearance. She is well-developed. She is not ill-appearing, toxic-appearing or diaphoretic.  HENT:     Head: Normocephalic and atraumatic.     Nose: Nose normal.     Mouth/Throat:     Mouth: Mucous membranes are moist.     Pharynx: Oropharynx is clear.  Eyes:     General: No scleral icterus.       Right eye: No discharge.        Left eye: No discharge.     Extraocular Movements: Extraocular movements intact.     Conjunctiva/sclera: Conjunctivae normal.     Pupils: Pupils are equal, round, and reactive to light.  Cardiovascular:     Rate and Rhythm: Normal rate.  Pulmonary:     Effort: Pulmonary effort  is normal.  Skin:    General: Skin is warm and dry.       Neurological:     General: No focal deficit present.     Mental Status: She is alert and oriented to person, place, and time.  Psychiatric:        Mood and Affect: Mood normal.        Behavior: Behavior normal.        Thought Content: Thought content normal.        Judgment: Judgment normal.     Assessment and Plan :   PDMP not reviewed this encounter.  1. Itching   2. Tick bite of left thigh, initial encounter     Recommended managing with antibiotic prophylaxis using doxycycline 200 mg once.  Use triamcinolone cream for what I suspect is contact dermatitis.  No signs of local infection.  Recommend supportive care otherwise. Counseled patient on potential for adverse effects with medications prescribed/recommended today, ER and return-to-clinic precautions discussed, patient verbalized understanding.    Wallis Bamberg,  PA-C 09/16/20 1737

## 2020-09-16 NOTE — ED Triage Notes (Signed)
Pt c/o being bitten by a tick 3 days ago.  Pt states she feels jittery and has been having cold sweats. She states she was bitten on the inner thigh. Pt states the bite is swelling.

## 2020-10-25 IMAGING — DX DG FOREARM 2V*R*
2 series · 2 of 2 positions shown · non-contrast
Comparison: None.

CLINICAL DATA: Right arm pain status post injury

EXAM:
RIGHT FOREARM - 2 VIEW

[forearm ap]
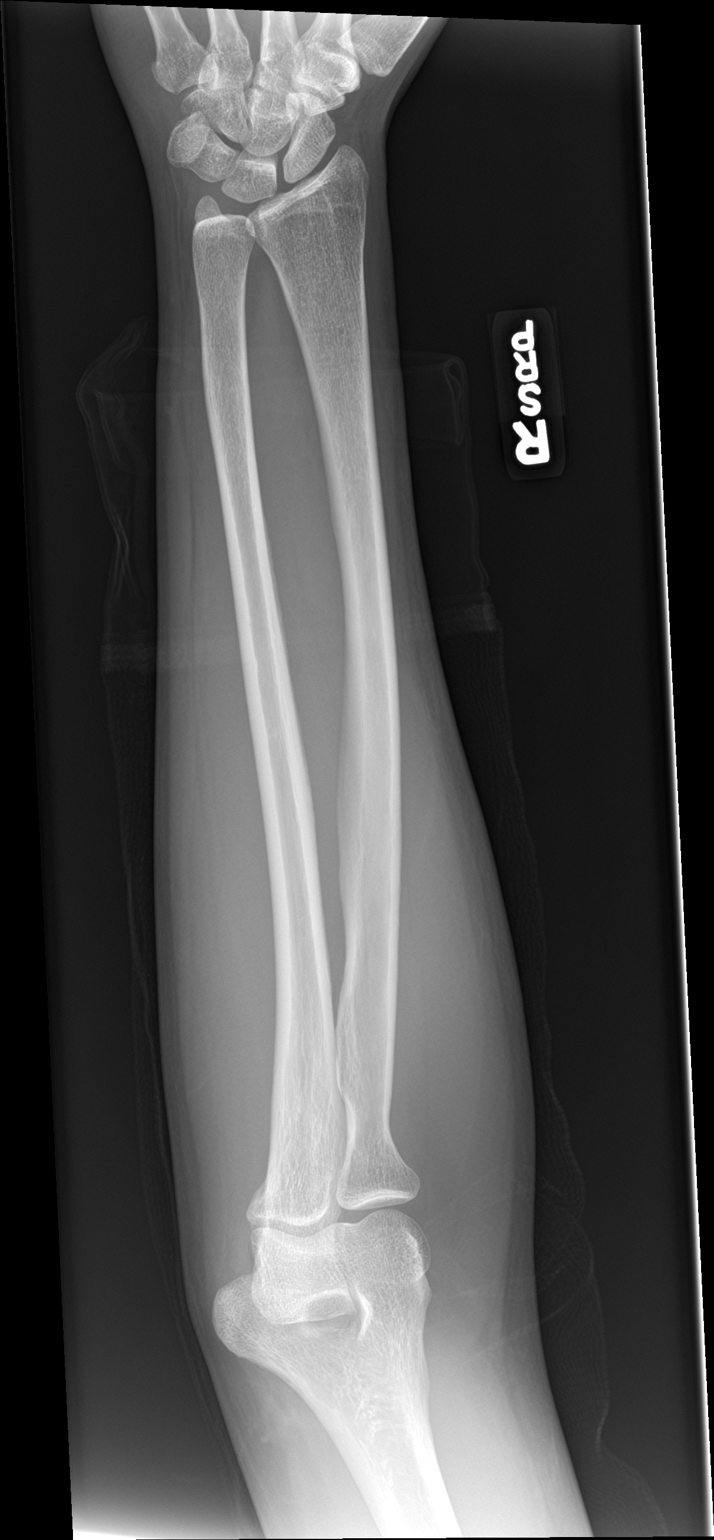

[forearm lat]
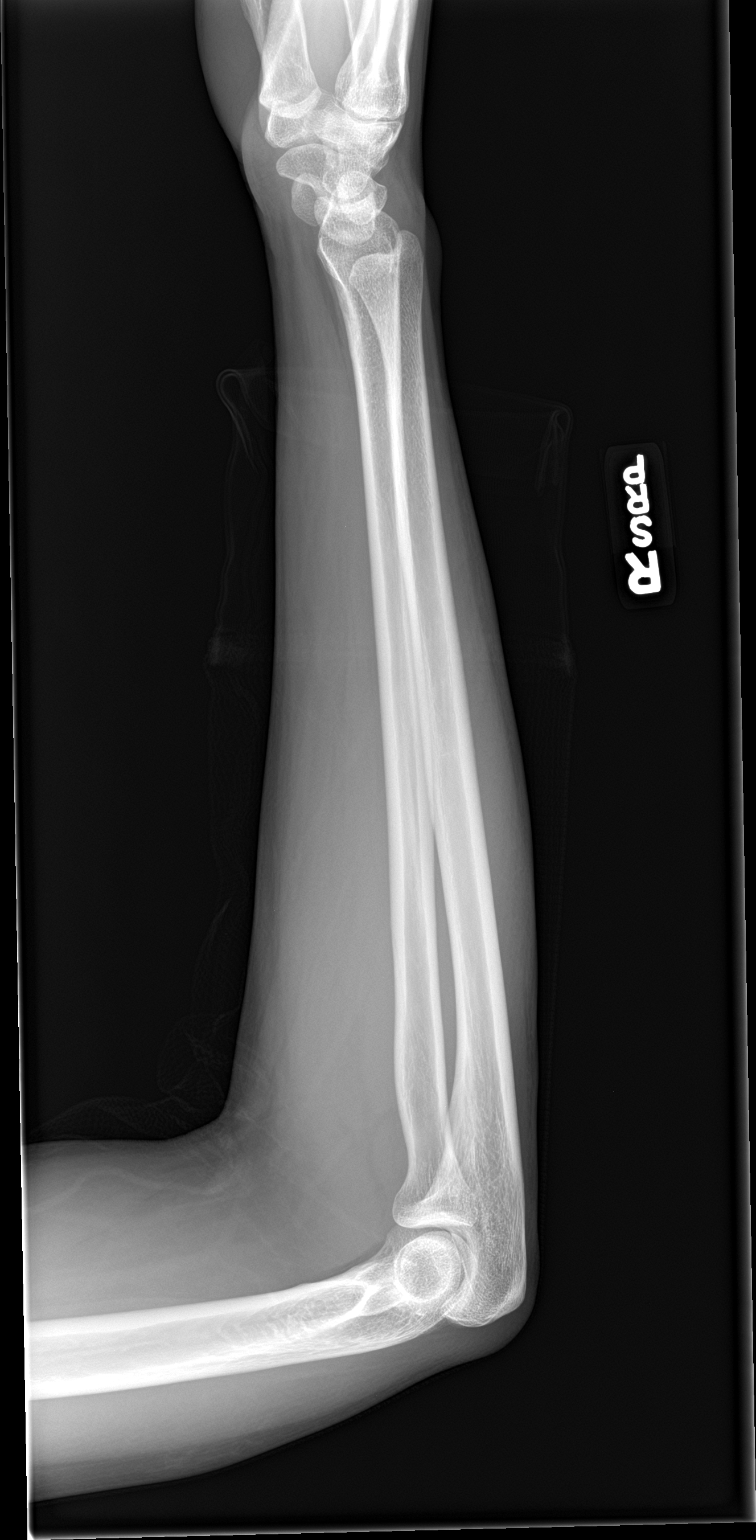

[2 of 2 positions shown; findings below may reference images not displayed]

FINDINGS: There is no evidence of fracture or other focal bone lesions. Soft
tissues are unremarkable.
IMPRESSION: No acute osseous injury of the right forearm.

## 2020-11-25 ENCOUNTER — Ambulatory Visit (HOSPITAL_COMMUNITY)
Admission: EM | Admit: 2020-11-25 | Discharge: 2020-11-25 | Disposition: A | Discharge status: Home / Self Care | Payer: 59 | Attending: Student | Admitting: Student

## 2020-11-25 ENCOUNTER — Other Ambulatory Visit: Payer: Self-pay

## 2020-11-25 ENCOUNTER — Encounter (HOSPITAL_COMMUNITY): Payer: Self-pay

## 2020-11-25 DIAGNOSIS — N12 Tubulo-interstitial nephritis, not specified as acute or chronic: Secondary | ICD-10-CM | POA: Insufficient documentation

## 2020-11-25 LAB — POCT URINALYSIS DIPSTICK, ED / UC
Bilirubin Urine: NEGATIVE
Glucose, UA: NEGATIVE mg/dL
Nitrite: POSITIVE — AB
Protein, ur: NEGATIVE mg/dL
Specific Gravity, Urine: 1.02 (ref 1.005–1.030)
Urobilinogen, UA: 2 mg/dL — ABNORMAL HIGH (ref 0.0–1.0)
pH: 7 (ref 5.0–8.0)

## 2020-11-25 MED ORDER — SULFAMETHOXAZOLE-TRIMETHOPRIM 800-160 MG PO TABS: 1.0000 | ORAL_TABLET | Freq: Two times a day (BID) | ORAL | 0 refills | Status: AC

## 2020-11-25 NOTE — ED Triage Notes (Signed)
Pt presents with urinary frequency, right side flank pain, and strong urine odor X 3 days.
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
## 2020-11-25 NOTE — Discharge Instructions (Addendum)
-  Bactrim (trimethoprim-sulfamethoxazole) twice daily for 7 days. -Make sure to drink plenty of fluids. -There is a small chance you have a kidney stone.  Most kidney stones can pass on their own, but some of them are too big and can get stuck.  This can lead to a infection.  If your symptoms are getting worse instead of better despite treatment, this could mean that you have a kidney stone that is stuck.  Head to the emergency department if you develop new symptoms like worsening of the abdominal pain, back pain, new fever/chills, etc.

## 2020-11-28 LAB — URINE CULTURE: Culture: >=100000 — AB

## 2021-01-17 ENCOUNTER — Encounter (HOSPITAL_COMMUNITY): Payer: Self-pay | Admitting: Emergency Medicine

## 2021-01-17 ENCOUNTER — Emergency Department (HOSPITAL_COMMUNITY)
Admission: EM | Admit: 2021-01-17 | Discharge: 2021-01-17 | Disposition: A | Discharge status: Home / Self Care | Payer: 59 | Attending: Emergency Medicine | Admitting: Emergency Medicine

## 2021-01-17 ENCOUNTER — Emergency Department (HOSPITAL_COMMUNITY): Payer: 59

## 2021-01-17 ENCOUNTER — Other Ambulatory Visit: Payer: Self-pay

## 2021-01-17 ENCOUNTER — Ambulatory Visit (HOSPITAL_COMMUNITY)
Admission: EM | Admit: 2021-01-17 | Discharge: 2021-01-17 | Disposition: A | Discharge status: Home / Self Care | Payer: 59

## 2021-01-17 DIAGNOSIS — S0990XA Unspecified injury of head, initial encounter: Secondary | ICD-10-CM | POA: Diagnosis not present

## 2021-01-17 DIAGNOSIS — S1093XA Contusion of unspecified part of neck, initial encounter: Secondary | ICD-10-CM

## 2021-01-17 DIAGNOSIS — S50872A Other superficial bite of left forearm, initial encounter: Secondary | ICD-10-CM | POA: Diagnosis not present

## 2021-01-17 DIAGNOSIS — S161XXA Strain of muscle, fascia and tendon at neck level, initial encounter: Secondary | ICD-10-CM

## 2021-01-17 DIAGNOSIS — S62524A Nondisplaced fracture of distal phalanx of right thumb, initial encounter for closed fracture: Secondary | ICD-10-CM | POA: Diagnosis not present

## 2021-01-17 DIAGNOSIS — G8929 Other chronic pain: Secondary | ICD-10-CM

## 2021-01-17 DIAGNOSIS — S0093XA Contusion of unspecified part of head, initial encounter: Secondary | ICD-10-CM

## 2021-01-17 DIAGNOSIS — G44319 Acute post-traumatic headache, not intractable: Secondary | ICD-10-CM

## 2021-01-17 DIAGNOSIS — S0083XA Contusion of other part of head, initial encounter: Secondary | ICD-10-CM

## 2021-01-17 DIAGNOSIS — W503XXA Accidental bite by another person, initial encounter: Secondary | ICD-10-CM

## 2021-01-17 DIAGNOSIS — Z23 Encounter for immunization: Secondary | ICD-10-CM | POA: Insufficient documentation

## 2021-01-17 DIAGNOSIS — S0101XA Laceration without foreign body of scalp, initial encounter: Secondary | ICD-10-CM

## 2021-01-17 DIAGNOSIS — S62501A Fracture of unspecified phalanx of right thumb, initial encounter for closed fracture: Secondary | ICD-10-CM

## 2021-01-17 DIAGNOSIS — M79645 Pain in left finger(s): Secondary | ICD-10-CM | POA: Diagnosis present

## 2021-01-17 DIAGNOSIS — S61213A Laceration without foreign body of left middle finger without damage to nail, initial encounter: Secondary | ICD-10-CM

## 2021-01-17 DIAGNOSIS — M79644 Pain in right finger(s): Secondary | ICD-10-CM

## 2021-01-17 LAB — POC URINE PREG, ED: Preg Test, Ur: NEGATIVE

## 2021-01-17 MED ORDER — DOXYCYCLINE HYCLATE 100 MG PO CAPS: 100.0000 mg | ORAL_CAPSULE | Freq: Two times a day (BID) | ORAL | 0 refills | Status: AC

## 2021-01-17 MED ORDER — CLINDAMYCIN HCL 150 MG PO CAPS: 450.0000 mg | ORAL_CAPSULE | Freq: Three times a day (TID) | ORAL | 0 refills | Status: AC

## 2021-01-17 MED ORDER — TETANUS-DIPHTH-ACELL PERTUSSIS 5-2.5-18.5 LF-MCG/0.5 IM SUSY
0.5000 mL | PREFILLED_SYRINGE | Freq: Once | INTRAMUSCULAR | Status: AC
  Administered 2021-01-17: 0.5 mL via INTRAMUSCULAR
  Filled 2021-01-17: qty 0.5

## 2021-01-17 MED ORDER — METHOCARBAMOL 500 MG PO TABS: 500.0000 mg | ORAL_TABLET | Freq: Two times a day (BID) | ORAL | 0 refills | Status: DC
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
## 2021-01-17 NOTE — Discharge Instructions (Addendum)
Take the antibiotics as prescribed. Follow-up with the hand specialist. Return to the ER if you start to experience worsening pain, signs of infection including redness, increased swelling or drainage, fever.

## 2021-01-17 NOTE — Progress Notes (Signed)
Orthopedic Tech Progress Note Patient Details:  Mee Macdonnell Jan 06, 1976 [id number]  Ortho Devices Type of Ortho Device: Thumb spica splint Splint Material: Fiberglass Ortho Device/Splint Location: Right thumb Ortho Device/Splint Interventions: Application   Post Interventions Patient Tolerated: Well  Genelle Bal Delmer Kowalski 01/17/2021, 4:01 PM
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
## 2022-06-23 ENCOUNTER — Ambulatory Visit (HOSPITAL_COMMUNITY)
Admission: RE | Admit: 2022-06-23 | Discharge: 2022-06-23 | Disposition: A | Discharge status: Home / Self Care | Payer: 59 | Source: Ambulatory Visit | Attending: Internal Medicine | Admitting: Internal Medicine

## 2022-06-23 ENCOUNTER — Encounter (HOSPITAL_COMMUNITY): Payer: Self-pay

## 2022-06-23 VITALS — BP 121/86 | HR 79 | Temp 99.0°F | Resp 18

## 2022-06-23 DIAGNOSIS — Z3201 Encounter for pregnancy test, result positive: Secondary | ICD-10-CM

## 2022-06-23 LAB — POC URINE PREG, ED: Preg Test, Ur: POSITIVE — AB

## 2022-06-23 NOTE — Discharge Instructions (Addendum)
Your pregnancy test was positive today Abstain from smoking, drinking alcohol or doing drugs which could affect the fetus Tylenol is safe for headaches Take a prenatal vitamin daily

## 2022-06-23 NOTE — ED Triage Notes (Signed)
Pt states has had 6 positive pregnancy test. States having a little headache and heartburn at times. Denies vomiting.
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
## 2022-07-20 ENCOUNTER — Encounter (HOSPITAL_COMMUNITY): Payer: Self-pay

## 2022-07-20 ENCOUNTER — Inpatient Hospital Stay (HOSPITAL_COMMUNITY)
Admission: AD | Admit: 2022-07-20 | Discharge: 2022-07-20 | Disposition: A | Discharge status: Home / Self Care | Payer: 59 | Attending: Obstetrics and Gynecology | Admitting: Obstetrics and Gynecology

## 2022-07-20 ENCOUNTER — Inpatient Hospital Stay (HOSPITAL_COMMUNITY): Payer: 59

## 2022-07-20 DIAGNOSIS — Z3A12 12 weeks gestation of pregnancy: Secondary | ICD-10-CM | POA: Diagnosis not present

## 2022-07-20 DIAGNOSIS — R110 Nausea: Secondary | ICD-10-CM | POA: Insufficient documentation

## 2022-07-20 DIAGNOSIS — O209 Hemorrhage in early pregnancy, unspecified: Secondary | ICD-10-CM | POA: Diagnosis not present

## 2022-07-20 DIAGNOSIS — O26891 Other specified pregnancy related conditions, first trimester: Secondary | ICD-10-CM | POA: Insufficient documentation

## 2022-07-20 DIAGNOSIS — O26851 Spotting complicating pregnancy, first trimester: Secondary | ICD-10-CM | POA: Diagnosis present

## 2022-07-20 DIAGNOSIS — Z3491 Encounter for supervision of normal pregnancy, unspecified, first trimester: Secondary | ICD-10-CM

## 2022-07-20 LAB — URINALYSIS, ROUTINE W REFLEX MICROSCOPIC
Bilirubin Urine: NEGATIVE
Glucose, UA: NEGATIVE mg/dL
Ketones, ur: NEGATIVE mg/dL
Leukocytes,Ua: NEGATIVE
Nitrite: NEGATIVE
Protein, ur: NEGATIVE mg/dL
Specific Gravity, Urine: 1.008 (ref 1.005–1.030)
pH: 7 (ref 5.0–8.0)

## 2022-07-20 LAB — HCG, QUANTITATIVE, PREGNANCY: hCG, Beta Chain, Quant, S: 104975 m[IU]/mL — ABNORMAL HIGH (ref <5)

## 2022-07-20 LAB — WET PREP, GENITAL
Sperm: NONE SEEN
Trich, Wet Prep: NONE SEEN
WBC, Wet Prep HPF POC: <10 (ref <10)
Yeast Wet Prep HPF POC: NONE SEEN

## 2022-07-20 MED ORDER — PREPLUS 27-1 MG PO TABS: 1.0000 | ORAL_TABLET | Freq: Every day | ORAL | 13 refills | Status: AC

## 2022-07-20 NOTE — Discharge Instructions (Signed)
[place] for [employer] at [employer] [facility]             [address] [place], [place] [address] [place] for [facility] at [place], [place], [place], [place], [address] [phone]  Center for [facility] at [facility], [place], [place], [place], [address] [phone]  Center for [facility] at [facility] [address] [place], [place], [place], [address] [phone]  Center for [facility] at [facility]                                 [place], [place], [place], [address] [phone]  Center for [facility]                                    [address] [place], [place], [address] [place] for [place] at [facility] [address], [place], [place], [place], [address]                              [facility] [place], [place], [place], [place], [address] [phone]  [facility]         Phone: [phone]  [facility]      Phone: [phone]   [facility]      Phone: [place] [facility]         Phone: [phone]   [place] [facility]-Maternity    Phone: [place]      Phone: [phone]  [facility]     Phone: [phone]  [facility]        Phone: [phone]  [facility] [place]) [phone]  [facility]      Phone: [phone]

## 2022-07-20 NOTE — MAU Provider Note (Signed)
History     CSN: [id number]  Arrival date and time: 07/20/22 1817   Event Date/Time   First Provider Initiated Contact with Patient 07/20/22 1845     Chief Complaint  Patient presents with   Vaginal Bleeding   HPI [name] is 47 y.o. G1P0  currently 24w2dpresenting with new onset bright red spotting tonight. Noticed she needed to go to bathroom and noted blood. Denies dysuria, polyuria. She reports having unprotected sex around [place] Last period was in mid December. Reports she does have nausea, smells are work are triggering her. She works as a lMicrobiologistwith fumes. Denies pain or cramping.   OB History     Gravida  1   Para      Term      Preterm      AB      Living         SAB      IAB      Ectopic      Multiple      Live Births              Past Medical History:  Diagnosis Date   Asthma    as teenager   Headache    migraines   Renal disorder    ? infection; has resolved per pt   Seasonal allergies     Past Surgical History:  Procedure Laterality Date   MASS EXCISION Left 09/02/2016   Procedure: EXCISION LEFT GANGLION VOLAR CYST ON WRIST;  Surgeon: GDaryll Brod MD;  Location: [facility]  Service: Orthopedics;  Laterality: Left;    Family History  Problem Relation Age of Onset   Cancer Mother    Non-Hodgkin's lymphoma Father     Social History   Tobacco Use   Smoking status: Every Day    Packs/day: 0.25    Types: Cigarettes   Smokeless tobacco: Never   Tobacco comments:    Trying to quit  Vaping Use   Vaping Use: Every day  Substance Use Topics   Alcohol use: Not Currently    Comment: socially   Drug use: No    Allergies:  Allergies  Allergen Reactions   Penicillins Anaphylaxis   Celery Oil    Latex Rash    itching    Medications Prior to Admission  Medication Sig Dispense Refill Last Dose   acetaminophen (TYLENOL) 500 MG tablet Take 1,000 mg by mouth every 6 (six) hours as needed for  moderate pain.      BIOTIN PO Take 1 tablet by mouth daily.      Echinacea 125 MG CAPS Take 1 capsule by mouth daily.      fluticasone (FLONASE) 50 MCG/ACT nasal spray Place 1 spray daily into both nostrils. (Patient not taking: No sig reported) 16 g 2    naproxen (NAPROSYN) 500 MG tablet Take 1 tablet (500 mg total) by mouth 2 (two) times daily with a meal. (Patient not taking: No sig reported) 30 tablet 0    triamcinolone cream (KENALOG) 0.1 % Apply 1 application topically 2 (two) times daily. (Patient taking differently: Apply 1 application topically 2 (two) times daily as needed.) 30 g 0     Review of Systems  Constitutional:  Negative for chills and fever.  HENT:  Negative for congestion and sore throat.   Eyes:  Negative for pain and visual disturbance.  Respiratory:  Negative for cough, chest tightness and shortness of breath.   Cardiovascular:  Negative for chest pain.  Gastrointestinal:  Negative for abdominal pain, diarrhea, nausea and vomiting.  Endocrine: Negative for cold intolerance and heat intolerance.  Genitourinary:  Negative for dysuria and flank pain.  Musculoskeletal:  Negative for back pain.  Skin:  Negative for rash.  Allergic/Immunologic: Negative for food allergies.  Neurological:  Negative for dizziness and light-headedness.  Psychiatric/Behavioral:  Negative for agitation.    Physical Exam   Blood pressure 114/69, pulse 84, temperature 99 F (37.2 C), temperature source Oral, resp. rate 15, height 5' 3"$  (1.6 m), weight 60.3 kg, last menstrual period 05/23/2022, SpO2 99 %.  Physical Exam Vitals and nursing note reviewed.  Constitutional:      General: She is not in acute distress.    Appearance: She is well-developed.     Comments: Wearing mask. Talking in complete sentences. Comfortable  HENT:     Head: Normocephalic and atraumatic.  Eyes:     General: No scleral icterus.    Conjunctiva/sclera: Conjunctivae normal.  Cardiovascular:     Rate and  Rhythm: Normal rate.  Pulmonary:     Effort: Pulmonary effort is normal.  Chest:     Chest wall: No tenderness.  Abdominal:     General: Abdomen is flat.     Palpations: Abdomen is soft.     Tenderness: There is no abdominal tenderness. There is no guarding or rebound.  Genitourinary:    Vagina: Normal.  Musculoskeletal:        General: Normal range of motion.     Cervical back: Normal range of motion and neck supple.  Skin:    General: Skin is warm and dry.     Findings: No rash.  Neurological:     Mental Status: She is alert and oriented to person, place, and time.     MAU Course  Procedures  MDM: moderate  This patient presents to the ED for concern of   Chief Complaint  Patient presents with   Vaginal Bleeding     These complains involves an extensive number of treatment options, and is a complaint that carries with it a high risk of complications and morbidity.  The differential diagnosis for  1. Vaginal bleeding in unknown viability INCLUDES ectopic, miscarriage, threatened miscarriage, normal variant, UTI   Co morbidities that complicate the patient evaluation: AMA  External records from outside source obtained and reviewed including Prenatal care records  Lab Tests: BHCG and Other Type and Screen  I ordered, and personally interpreted labs.  The pertinent results include:  Type and screen- rhogam not indicated  Imaging Studies ordered:  I ordered imaging studies includingTransvaginal [place] I independently visualized and interpreted imaging which showed 12 wk fetus with EDD 02/01/23 I agree with the radiologist interpretation  Medicines ordered and prescription drug management:  Medications: None   Reevaluation of the patient after these medicines showed that the patient improved I have reviewed the patients home medicines and have made adjustments as needed   MAU Course: Updated patient with [place] results, given images. Discussed she is farther along than 8  weeks.   Results for orders placed or performed during the hospital encounter of 07/20/22 (from the past 24 hour(s))  ABO/Rh     Status: None   Collection Time: 07/20/22  6:53 PM  Result Value Ref Range   ABO/RH(D)      A POS Performed at [facility], [place] [address] [place], [place] [address]   Wet prep, genital  Status: Abnormal   Collection Time: 07/20/22  7:05 PM   Specimen: PATH Cytology Cervicovaginal Ancillary Only  Result Value Ref Range   Yeast Wet Prep HPF POC NONE SEEN NONE SEEN   Trich, Wet Prep NONE SEEN NONE SEEN   Clue Cells Wet Prep HPF POC PRESENT (A) NONE SEEN   WBC, Wet Prep HPF POC <10 <10   Sperm NONE SEEN   Urinalysis, Routine w reflex microscopic -Urine, Clean Catch     Status: Abnormal   Collection Time: 07/20/22  7:05 PM  Result Value Ref Range   Color, Urine STRAW (A) YELLOW   APPearance CLEAR CLEAR   Specific Gravity, Urine 1.008 1.005 - 1.030   pH 7.0 5.0 - 8.0   Glucose, UA NEGATIVE NEGATIVE mg/dL   Hgb urine dipstick SMALL (A) NEGATIVE   Bilirubin Urine NEGATIVE NEGATIVE   Ketones, ur NEGATIVE NEGATIVE mg/dL   Protein, ur NEGATIVE NEGATIVE mg/dL   Nitrite NEGATIVE NEGATIVE   Leukocytes,Ua NEGATIVE NEGATIVE   RBC / HPF 0-5 0 - 5 RBC/hpf   WBC, UA 0-5 0 - 5 WBC/hpf   Bacteria, UA RARE (A) NONE SEEN   Squamous Epithelial / HPF 0-5 0 - 5 /HPF     After the interventions noted above, I reevaluated the patient and found that they have :improved  Dispostion: discharged   Assessment and Plan   1. Vaginal bleeding affecting early pregnancy   2. Viable pregnancy in first trimester   3. [redacted] weeks gestation of pregnancy    - Establish prenatal care - EDD 02/01/23 by Kendra Opitz [place] - Wet prep with BV but no sx today, will not treat - Given bleeding precautions  Caren Macadam 07/20/2022, 8:32 PM

## 2022-07-20 NOTE — MAU Note (Signed)
...  [name] is a 47 y.o. at Unknown here in MAU reporting: Vaginal spotting that began around an hour ago. She denies seeing any blood clots. 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 Denies vaginal itching and vaginal odors. Denies pain.    LMP: Unsure. Potentially 05/23/2022. Onset of complaint: One hour ago  Pain score: Denies pain.

## 2022-07-21 LAB — ABO/RH: ABO/RH(D): A POS

## 2022-07-22 LAB — GC/CHLAMYDIA PROBE AMP (~~LOC~~) NOT AT ARMC
Chlamydia: NEGATIVE
Comment: NEGATIVE
Comment: NORMAL
Neisseria Gonorrhea: NEGATIVE

## 2022-08-05 LAB — OB RESULTS CONSOLE ABO/RH: RH Type: POSITIVE

## 2022-08-05 LAB — HEPATITIS C ANTIBODY: HCV Ab: NEGATIVE

## 2022-08-05 LAB — OB RESULTS CONSOLE ANTIBODY SCREEN: Antibody Screen: NEGATIVE

## 2022-08-05 LAB — OB RESULTS CONSOLE RPR: RPR: NONREACTIVE

## 2022-08-05 LAB — OB RESULTS CONSOLE RUBELLA ANTIBODY, IGM: Rubella: IMMUNE

## 2022-08-05 LAB — OB RESULTS CONSOLE HIV ANTIBODY (ROUTINE TESTING): HIV: NONREACTIVE

## 2022-08-05 LAB — OB RESULTS CONSOLE HEPATITIS B SURFACE ANTIGEN: Hepatitis B Surface Ag: NEGATIVE

## 2022-08-16 ENCOUNTER — Encounter (HOSPITAL_BASED_OUTPATIENT_CLINIC_OR_DEPARTMENT_OTHER): Payer: Self-pay

## 2022-08-16 ENCOUNTER — Other Ambulatory Visit: Payer: Self-pay

## 2022-08-16 ENCOUNTER — Emergency Department (HOSPITAL_BASED_OUTPATIENT_CLINIC_OR_DEPARTMENT_OTHER): Payer: Worker's Compensation

## 2022-08-16 DIAGNOSIS — E871 Hypo-osmolality and hyponatremia: Secondary | ICD-10-CM | POA: Diagnosis not present

## 2022-08-16 DIAGNOSIS — J45909 Unspecified asthma, uncomplicated: Secondary | ICD-10-CM | POA: Diagnosis not present

## 2022-08-16 DIAGNOSIS — R1011 Right upper quadrant pain: Secondary | ICD-10-CM | POA: Insufficient documentation

## 2022-08-16 DIAGNOSIS — F17210 Nicotine dependence, cigarettes, uncomplicated: Secondary | ICD-10-CM | POA: Insufficient documentation

## 2022-08-16 DIAGNOSIS — Z91040 Latex allergy status: Secondary | ICD-10-CM | POA: Diagnosis not present

## 2022-08-16 LAB — CBC WITH DIFFERENTIAL/PLATELET
Abs Immature Granulocytes: 0.08 10*3/uL — ABNORMAL HIGH (ref 0.00–0.07)
Basophils Absolute: 0 10*3/uL (ref 0.0–0.1)
Basophils Relative: 0 %
Eosinophils Absolute: 0 10*3/uL (ref 0.0–0.5)
Eosinophils Relative: 0 %
HCT: 35.3 % — ABNORMAL LOW (ref 36.0–46.0)
Hemoglobin: 12.2 g/dL (ref 12.0–15.0)
Immature Granulocytes: 1 %
Lymphocytes Relative: 35 %
Lymphs Abs: 3.6 10*3/uL (ref 0.7–4.0)
MCH: 29.8 pg (ref 26.0–34.0)
MCHC: 34.6 g/dL (ref 30.0–36.0)
MCV: 86.3 fL (ref 80.0–100.0)
Monocytes Absolute: 1 10*3/uL (ref 0.1–1.0)
Monocytes Relative: 9 %
Neutro Abs: 5.5 10*3/uL (ref 1.7–7.7)
Neutrophils Relative %: 55 %
Platelets: 241 10*3/uL (ref 150–400)
RBC: 4.09 MIL/uL (ref 3.87–5.11)
RDW: 12.9 % (ref 11.5–15.5)
WBC: 10.2 10*3/uL (ref 4.0–10.5)
nRBC: 0 % (ref 0.0–0.2)

## 2022-08-16 LAB — URINALYSIS, ROUTINE W REFLEX MICROSCOPIC
Bilirubin Urine: NEGATIVE
Glucose, UA: NEGATIVE mg/dL
Ketones, ur: NEGATIVE mg/dL
Leukocytes,Ua: NEGATIVE
Nitrite: NEGATIVE
Protein, ur: NEGATIVE mg/dL
Specific Gravity, Urine: <=1.005 (ref 1.005–1.030)
pH: 5.5 (ref 5.0–8.0)

## 2022-08-16 LAB — COMPREHENSIVE METABOLIC PANEL
ALT: 18 U/L (ref 0–44)
AST: 19 U/L (ref 15–41)
Albumin: 3.5 g/dL (ref 3.5–5.0)
Alkaline Phosphatase: 40 U/L (ref 38–126)
Anion gap: 7 (ref 5–15)
BUN: 14 mg/dL (ref 6–20)
CO2: 19 mmol/L — ABNORMAL LOW (ref 22–32)
Calcium: 8.8 mg/dL — ABNORMAL LOW (ref 8.9–10.3)
Chloride: 102 mmol/L (ref 98–111)
Creatinine, Ser: 0.48 mg/dL (ref 0.44–1.00)
GFR, Estimated: >60 mL/min (ref >60)
Glucose, Bld: 76 mg/dL (ref 70–99)
Potassium: 3.6 mmol/L (ref 3.5–5.1)
Sodium: 128 mmol/L — ABNORMAL LOW (ref 135–145)
Total Bilirubin: 0.4 mg/dL (ref 0.3–1.2)
Total Protein: 7.2 g/dL (ref 6.5–8.1)

## 2022-08-16 LAB — URINALYSIS, MICROSCOPIC (REFLEX)

## 2022-08-16 LAB — LIPASE, BLOOD: Lipase: 32 U/L (ref 11–51)
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
## 2022-08-17 ENCOUNTER — Emergency Department (HOSPITAL_BASED_OUTPATIENT_CLINIC_OR_DEPARTMENT_OTHER)
Admission: EM | Admit: 2022-08-17 | Discharge: 2022-08-17 | Disposition: A | Discharge status: Home / Self Care | Payer: Worker's Compensation | Attending: Emergency Medicine | Admitting: Emergency Medicine

## 2022-08-17 DIAGNOSIS — E871 Hypo-osmolality and hyponatremia: Secondary | ICD-10-CM

## 2022-08-17 DIAGNOSIS — R1011 Right upper quadrant pain: Secondary | ICD-10-CM

## 2022-08-17 LAB — HCG, QUANTITATIVE, PREGNANCY: hCG, Beta Chain, Quant, S: 48871 m[IU]/mL — ABNORMAL HIGH (ref <5)

## 2022-08-17 NOTE — ED Notes (Signed)
Reviewed AVS with patient, patient expressed understanding of directions, denies further questions at this time. 

## 2022-08-17 NOTE — Discharge Instructions (Signed)
Please follow-up with your primary care physician or OB/GYN for recheck of your sodium.  Your sodium was a little low at 128.

## 2022-08-17 NOTE — ED Provider Notes (Signed)
[facility] Provider Note: Georgena Spurling, MD, FACEP  CSN: [id number] MRN: [id number] ARRIVAL: 08/16/22 at 2248 ROOM: [place]  Abdominal Pain   HISTORY OF PRESENT ILLNESS  08/17/22 3:37 AM [name] is a 47 y.o. 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  She began having right upper quadrant pain about 9:15 PM yesterday evening.  She is concerned if she might of injured herself or pulled something at work.  She is about 15 to [redacted] weeks pregnant.  She has been getting prenatal care.  She rates her pain as an 8 out of 10, sharp in nature and worse when she moves or turns.   Past Medical History:  Diagnosis Date   Asthma    as teenager   Headache    migraines   Renal disorder    ? infection; has resolved per pt   Seasonal allergies     Past Surgical History:  Procedure Laterality Date   MASS EXCISION Left 09/02/2016   Procedure: EXCISION LEFT GANGLION VOLAR CYST ON WRIST;  Surgeon: Daryll Brod, MD;  Location: [facility];  Service: Orthopedics;  Laterality: Left;    Family History  Problem Relation Age of Onset   Cancer Mother    Non-Hodgkin's lymphoma Father     Social History   Tobacco Use   Smoking status: Every Day    Packs/day: 0.25    Types: Cigarettes   Smokeless tobacco: Never   Tobacco comments:    Trying to quit  Vaping Use   Vaping Use: Every day  Substance Use Topics   Alcohol use: Not Currently    Comment: socially   Drug use: No    Prior to Admission medications   Medication Sig Start Date End Date Taking? Authorizing Provider  acetaminophen (TYLENOL) 500 MG tablet Take 1,000 mg by mouth every 6 (six) hours as needed for moderate pain.    [provider]  BIOTIN PO Take 1 tablet by mouth daily.    [provider]  Echinacea 125 MG CAPS Take 1 capsule by mouth daily.    [provider]  Prenatal Vit-Fe Fumarate-FA (PREPLUS) 27-1 MG TABS Take 1 tablet by mouth daily. 07/20/22    Caren Macadam, MD    Allergies Penicillins, Celery oil, and Latex   REVIEW OF SYSTEMS  Negative except as noted here or in the History of Present Illness.   PHYSICAL EXAMINATION  Initial Vital Signs Blood pressure 99/68, pulse 63, temperature 98.3 F (36.8 C), resp. rate 18, height '5\' 1"'$  (1.549 m), weight 61.2 kg, last menstrual period 05/23/2022, SpO2 99 %.  Examination General: Well-developed, well-nourished female in no acute distress; appearance consistent with age of record HENT: normocephalic; atraumatic Eyes: Normal appearance Neck: supple Heart: regular rate and rhythm Lungs: clear to auscultation bilaterally Abdomen: soft; gravid, consistent with dates; right upper quadrant abdominal wall tenderness; bowel sounds present Extremities: No deformity; full range of motion; pulses normal Neurologic: Awake, alert and oriented; motor function intact in all extremities and symmetric; no facial droop Skin: Warm and dry Psychiatric: Normal mood and affect   RESULTS  Summary of this visit's results, reviewed and interpreted by myself:   EKG Interpretation  Date/Time:    Ventricular Rate:    PR Interval:    QRS Duration:   QT Interval:    QTC Calculation:   R Axis:     Text Interpretation:         Laboratory Studies: Results  for orders placed or performed during the hospital encounter of 08/17/22 (from the past 24 hour(s))  Lipase, blood     Status: None   Collection Time: 08/16/22 11:00 PM  Result Value Ref Range   Lipase 32 11 - 51 U/L  Comprehensive metabolic panel     Status: Abnormal   Collection Time: 08/16/22 11:00 PM  Result Value Ref Range   Sodium 128 (L) 135 - 145 mmol/L   Potassium 3.6 3.5 - 5.1 mmol/L   Chloride 102 98 - 111 mmol/L   CO2 19 (L) 22 - 32 mmol/L   Glucose, Bld 76 70 - 99 mg/dL   BUN 14 6 - 20 mg/dL   Creatinine, Ser 0.48 0.44 - 1.00 mg/dL   Calcium 8.8 (L) 8.9 - 10.3 mg/dL   Total Protein 7.2 6.5 - 8.1 g/dL   Albumin  3.5 3.5 - 5.0 g/dL   AST 19 15 - 41 U/L   ALT 18 0 - 44 U/L   Alkaline Phosphatase 40 38 - 126 U/L   Total Bilirubin 0.4 0.3 - 1.2 mg/dL   GFR, Estimated >60 >60 mL/min   Anion gap 7 5 - 15  CBC with Differential     Status: Abnormal   Collection Time: 08/16/22 11:00 PM  Result Value Ref Range   WBC 10.2 4.0 - 10.5 K/uL   RBC 4.09 3.87 - 5.11 MIL/uL   Hemoglobin 12.2 12.0 - 15.0 g/dL   HCT 35.3 (L) 36.0 - 46.0 %   MCV 86.3 80.0 - 100.0 fL   MCH 29.8 26.0 - 34.0 pg   MCHC 34.6 30.0 - 36.0 g/dL   RDW 12.9 11.5 - 15.5 %   Platelets 241 150 - 400 K/uL   nRBC 0.0 0.0 - 0.2 %   Neutrophils Relative % 55 %   Neutro Abs 5.5 1.7 - 7.7 K/uL   Lymphocytes Relative 35 %   Lymphs Abs 3.6 0.7 - 4.0 K/uL   Monocytes Relative 9 %   Monocytes Absolute 1.0 0.1 - 1.0 K/uL   Eosinophils Relative 0 %   Eosinophils Absolute 0.0 0.0 - 0.5 K/uL   Basophils Relative 0 %   Basophils Absolute 0.0 0.0 - 0.1 K/uL   Immature Granulocytes 1 %   Abs Immature Granulocytes 0.08 (H) 0.00 - 0.07 K/uL  hCG, quantitative, pregnancy     Status: Abnormal   Collection Time: 08/16/22 11:00 PM  Result Value Ref Range   hCG, Beta Chain, Quant, S 48,871 (H) <5 mIU/mL  Urinalysis, Routine w reflex microscopic -Urine, Clean Catch     Status: Abnormal   Collection Time: 08/16/22 11:39 PM  Result Value Ref Range   Color, Urine YELLOW YELLOW   APPearance CLEAR CLEAR   Specific Gravity, Urine <=1.005 1.005 - 1.030   pH 5.5 5.0 - 8.0   Glucose, UA NEGATIVE NEGATIVE mg/dL   Hgb urine dipstick TRACE (A) NEGATIVE   Bilirubin Urine NEGATIVE NEGATIVE   Ketones, ur NEGATIVE NEGATIVE mg/dL   Protein, ur NEGATIVE NEGATIVE mg/dL   Nitrite NEGATIVE NEGATIVE   Leukocytes,Ua NEGATIVE NEGATIVE  Urinalysis, Microscopic (reflex)     Status: Abnormal   Collection Time: 08/16/22 11:39 PM  Result Value Ref Range   RBC / HPF 0-5 0 - 5 RBC/hpf   WBC, UA 0-5 0 - 5 WBC/hpf   Bacteria, UA RARE (A) NONE SEEN   Squamous Epithelial / HPF  0-5 0 - 5 /HPF   Imaging Studies: US OB Limited  Result Date: 08/17/2022 CLINICAL DATA:  Right upper flank pain EXAM: LIMITED OBSTETRIC ULTRASOUND COMPARISON:  None Available. FINDINGS: Number of Fetuses: 1 Heart Rate:  169 bpm Movement: Yes Presentation: Breech Placental Location: Anterior Previa: No Amniotic Fluid (Subjective):  Within normal limits. BPD: 3.21 cm 16 w  0 d MATERNAL FINDINGS: Cervix:  Appears closed. Uterus/Adnexae: No abnormality visualized. IMPRESSION: Single viable intrauterine pregnancy as above. No specific abnormality is seen. This exam is performed on an emergent basis and does not comprehensively evaluate fetal size, dating, or anatomy; follow-up complete OB US should be considered if further fetal assessment is warranted. Electronically Signed   By: Donavan Foil M.D.   On: 08/17/2022 00:17    ED COURSE and MDM  Nursing notes, initial and subsequent vitals signs, including pulse oximetry, reviewed and interpreted by myself.  Vitals:   08/16/22 2255 08/16/22 2256 08/17/22 0330  BP:  99/63 99/68  Pulse:  78 63  Resp:  18 18  Temp:  98.4 F (36.9 C) 98.3 F (36.8 C)  SpO2:  100% 99%  Weight: 61.2 kg    Height: '5\' 1"'$  (1.549 m)     Medications - No data to display  I suspect the patient has a strain of the right upper abdominal wall.  Her laboratory studies are reassuring with exception of a mildly low sodium and she was advised is to follow-up with her PCP or OB/GYN regarding this.  Her quantitative beta-hCG is lower than it was previously but the pregnancy does appear viable on ultrasound.  PROCEDURES  Procedures   ED DIAGNOSES     ICD-10-CM   1. Abdominal wall pain in right upper quadrant  R10.11     2. Hyponatremia  E87.1          Adrean Heitz, Jenny Reichmann, MD 08/17/22 [phone]

## 2022-08-25 ENCOUNTER — Encounter: Payer: Self-pay | Admitting: Genetics

## 2022-09-13 ENCOUNTER — Ambulatory Visit: Payer: 59

## 2022-09-20 ENCOUNTER — Ambulatory Visit: Payer: 59 | Attending: Obstetrics and Gynecology

## 2022-09-20 DIAGNOSIS — O09522 Supervision of elderly multigravida, second trimester: Secondary | ICD-10-CM

## 2022-09-20 DIAGNOSIS — Z3A20 20 weeks gestation of pregnancy: Secondary | ICD-10-CM

## 2022-09-20 DIAGNOSIS — Z148 Genetic carrier of other disease: Secondary | ICD-10-CM

## 2022-09-20 NOTE — Progress Notes (Signed)
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   EDD: 02/01/2023 LMP: Not consistent with EGA by early ultrasound Referring Provider:  [facility]  EGA: [redacted]w[redacted]d Genetic Counselor: Teena Dunk, MS, CGC  OB Hx: [id number] Date of Appointment: 09/20/2022  Accompanied by: Malissa Hippo Face to Face Time: 40 Minutes   Previous Testing Completed: Francy previously completed cell-free DNA screening (cfDNA) in this pregnancy. The result is low risk, consistent with a female fetus. This screening significantly reduces the risk that the current pregnancy has Down syndrome, Trisomy 11, Trisomy 22, and common sex chromosome conditions, however, the risk is not zero given the limitations of cfDNA. Additionally, there are many genetic conditions that cannot be detected by cfDNA.  Keelie previously completed carrier screening. 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 She screened to not be a carrier for Cystic Fibrosis (CF), Alpha Thalassemia, and Beta Hemoglobinopathies. A negative result on carrier screening reduces the likelihood of being a carrier, however, does not entirely rule out the possibility.   Medical History:  This is [name]'s 2nd pregnancy. She has had one early loss many years ago. Reports she takes prenatal vitamins, Aspirin, and biotin. Reports smoking one cigarette on 09/19/2022. Denies personal history of diabetes, high blood pressure, thyroid conditions, and seizures. Denies bleeding, infections, and fevers in this pregnancy. Denies using alcohol or street drugs in this pregnancy.   Family History: A pedigree was created and scanned into Epic under the Media tab. The  pedigree is limited to [name]'s family as there are two potential biological fathers for the current pregnancy [name] and [name]). Donnabelle reports her maternal half brother had bilateral club feet. The club feet have been repaired and he is otherwise healthy with two healthy sons. Keyonia reports her other maternal half brother died from SIDS around 32 to 16 months of age.  Maternal ethnicity reported as African American. [name]' ethnicity is reported as [place] American and [place]. Jonathan's ethnicity is not know to [name] at this time.  Denies Ashkenazi Jewish ancestry for herself.      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 SMA is an autosomal recessive disease caused by loss of function mutations in the SMN1 gene. Typically, the SMN1 gene provides instructions for the Survival Motor Neuron (SMN) protein. This protein has important functions in the maintenance of motor neurons in the skeletal muscles that allow the body to move. Affected individuals with loss of function mutations in the SMN1 gene have a shortage of SMN protein which causes progressive motor neuron death, leading to the symptoms of SMA. A similar gene, SMN2, provides instructions to create a full length SMN protein approximately 10% of the time. Thus, the number of copies of the SMN2 gene an affected individual has modifies the severity of symptoms they experience and classification between different types of the condition. The five types of SMA listed below are historically classified based on clinical presentation, onset of symptoms, and the maximum skill level of motor milestones achieved. There is a lot of overlap between types. Type Zero is the most severe and is characterized by symptom onset in utero and death soon after  birth. Type I is the most common with symptom onset before six months of age a two-year life expectancy. Infants with SMA type I typically have  severe hypotonia, difficulty breathing, poor suck, and are unable to sit independently.  Type II has onset of symptoms between six to eighteen months of age. Individuals with SMA type II are able to sit without support, although they may not retain this skill, and they are not able to walk, have generalized muscle weakness, and may develop kyphoscoliosis.  Type III has onset of symptoms after eighteen months of age. Individuals with SMA type III may be able to sit and walk independently, although they may not retain these skills, and may develop scoliosis. Type IV has onset of symptoms in adulthood. Individuals with SMA type IV typically have mild motor impairment and respiratory problems. Several treatments are available for individuals affected with SMA and studies show that treatment is most effective when it is started in the first few months of life.  Lovie screened to have two functional copies of the SMN1 gene, however, due to limitations of genetic screening the laboratory cannot confirm if [name]'s two functional copies are on the same chromosome or on opposite chromosomes. The laboratory identified the variant [id number] in [name]'s screening, meaning there is increased risk for [name]'s two copies of the SMN1 gene to be on the same chromosome. Specifically, given [id number] ethnic background, her risk to be a silent carrier is approximately 1 in 52. We reviewed with [name] that if her two copies are on the same chromosome that means her other chromosome would have zero copies, and there could be risk for an affected pregnancy if her reproductive partner is found to be a carrier for SMA. Given [name]'Y carrier screening result, genetic counseling offered screening for both potential biological fathers of the pregnancy for SMA. [name] took a saliva kit home for one potential father of the pregnancy [name]) to complete at his earliest convenience. [name] shared that she will contact  gen[facility] counseling [facility] if the other potential father of the pregnancy [name]) opts to complete carrier screening for SMA. If only [name] completes carrier screening for SMA and his result is negative, there still may be a residual risk of having a child with SMA given that nonpaternity is of concern. If both potential fathers of the pregnancy complete carrier screening and are negative, it would be unlikely for the pregnancy to be affected with SMA. If one or both potential biological fathers of the pregnancy are found to be positive for SMA, prenatal diagnosis via amniocentesis is available. Arfa could also pursue amniocentesis for fetal SMA studies without screening either potential biological father of the pregnancy. However, a negative amniocentesis result without screening for the potential biological fathers would not be a true negative. We also discussed that SMA is included in [place] [facility] newborn screening program.  Advanced Maternal Age. Genetic counseling reviewed with Saher that as a woman ages, the risk for certain chromosomal conditions, such as Trisomy 35 (Down syndrome), Trisomy 13, and Trisomy 18, increases. These conditions often are not inherited, but instead occur due to an error in chromosomal division during the formation of sperm and egg cells in a process called nondisjunction. Shatera previously completed cell-free DNA screening (cfDNA) in this pregnancy. This screen analyzes cell-free DNA originating from the placenta that is found in the maternal blood circulation during pregnancy to provide information regarding the presence or absence of extra DNA for chromosomes  13, 18, and 21 as well as the sex chromosomes. The result is low risk, consistent with a female fetus. This screening significantly reduces the risk that the current pregnancy has Down syndrome, Trisomy 71, Trisomy 65, and common sex chromosome conditions, however, the risk is not  zero given the limitations of cfDNA. Genetic counseling reviewed this result with Paisely. Given that cfDNA is only a screening test we discussed amniocentesis for prenatal diagnosis of aneuploidy. Caylie declined amniocentesis at this time. She stated that she would be interested in amniocentesis if we could complete paternity testing on the amniotic fluid. We discussed that paternity testing can cost thousands of dollars when performed prenatally and that postnatal testing is much less expensive. However, we offered to contact [facility] to learn how they facilitate paternity testing on a prenatal sample. We will contact Suhey with information about paternity testing on a prenatal sample once we hear back from [facility].   Family History of Clubfoot. Parminder reports her maternal half brother had bilateral club feet. The club feet have been repaired and he is otherwise healthy with two healthy sons. Clubfoot is characterized by abnormal bone formation in the foot. It occurs with a frequency of 1 to 3 in 1,000 live births, making it the most common musculoskeletal abnormality. It can occur as an isolated abnormality or as part of more than 200 chromosomal, genetic, or sporadic multiple-malformation syndromes. Clubfoot is frequently secondary to lower extremity paralysis from a neural tube defect. There are also extrinsic causes of clubfoot, such as oligohydramnios, uterine cavity abnormality (leiomyomas), and amniotic bands. Genetic counseling reviewed with Anastasiya that clubfoot can often be visualized on prenatal ultrasound evaluation in the second and third trimester. Corinda is scheduled for an [facility] the [facility] on 10/04/2022. Furthermore, all babies have approximately a 3-5% risk for a birth defect and ultrasound is a screening tool that may detect some birth defects, but it may not detect all birth defects.     Thank you for sharing in the care of Nivedita with  [place].  Please do not hesitate to contact us if you have any questions.  Teena Dunk, MS, [facility]

## 2022-09-24 ENCOUNTER — Telehealth: Payer: Self-pay | Admitting: Genetics

## 2022-09-24 NOTE — Telephone Encounter (Signed)
Telephone call to [facility] regarding prenatal paternity testing. Left voicemail with the following information: [facility]'s DNA team would be able to facilitate paternity testing on amniotic fluid collected via an amniocentesis procedure If Leeba completed an amniocentesis the indication for the procedure would be that she is of advanced maternal age and genetic counselors in the [facility] would facilitate ordering/returning results of fetal chromosomal analysis. Any add-on paternity testing including explanation of the results from paternity testing would be taken care of between the laboratory and [facility] directly. The phone number to the DNA team that [facility] should call for more information about paternity testing is [phone].

## 2022-09-30 ENCOUNTER — Inpatient Hospital Stay (HOSPITAL_COMMUNITY)
Admission: AD | Admit: 2022-09-30 | Discharge: 2022-09-30 | Disposition: A | Discharge status: Home / Self Care | Payer: 59 | Attending: Obstetrics and Gynecology | Admitting: Obstetrics and Gynecology

## 2022-09-30 ENCOUNTER — Other Ambulatory Visit: Payer: Self-pay

## 2022-09-30 ENCOUNTER — Encounter (HOSPITAL_COMMUNITY): Payer: Self-pay | Admitting: Obstetrics and Gynecology

## 2022-09-30 DIAGNOSIS — M7918 Myalgia, other site: Secondary | ICD-10-CM | POA: Diagnosis not present

## 2022-09-30 DIAGNOSIS — O09529 Supervision of elderly multigravida, unspecified trimester: Secondary | ICD-10-CM | POA: Insufficient documentation

## 2022-09-30 DIAGNOSIS — O26892 Other specified pregnancy related conditions, second trimester: Secondary | ICD-10-CM | POA: Diagnosis not present

## 2022-09-30 DIAGNOSIS — O99320 Drug use complicating pregnancy, unspecified trimester: Secondary | ICD-10-CM | POA: Insufficient documentation

## 2022-09-30 DIAGNOSIS — Z3A22 22 weeks gestation of pregnancy: Secondary | ICD-10-CM | POA: Diagnosis not present

## 2022-09-30 DIAGNOSIS — R109 Unspecified abdominal pain: Secondary | ICD-10-CM | POA: Diagnosis present

## 2022-09-30 DIAGNOSIS — O99330 Smoking (tobacco) complicating pregnancy, unspecified trimester: Secondary | ICD-10-CM | POA: Insufficient documentation

## 2022-09-30 HISTORY — DX: Urinary tract infection, site not specified: N39.0

## 2022-09-30 HISTORY — DX: Depression, unspecified: F32.A

## 2022-09-30 HISTORY — DX: Dermatitis, unspecified: L30.9

## 2022-09-30 LAB — URINALYSIS, ROUTINE W REFLEX MICROSCOPIC
Bilirubin Urine: NEGATIVE
Glucose, UA: NEGATIVE mg/dL
Hgb urine dipstick: NEGATIVE
Ketones, ur: NEGATIVE mg/dL
Leukocytes,Ua: NEGATIVE
Nitrite: NEGATIVE
Protein, ur: NEGATIVE mg/dL
Specific Gravity, Urine: 1.021 (ref 1.005–1.030)
pH: 7 (ref 5.0–8.0)

## 2022-09-30 LAB — CBC
HCT: 33.4 % — ABNORMAL LOW (ref 36.0–46.0)
Hemoglobin: 11.4 g/dL — ABNORMAL LOW (ref 12.0–15.0)
MCH: 31.1 pg (ref 26.0–34.0)
MCHC: 34.1 g/dL (ref 30.0–36.0)
MCV: 91 fL (ref 80.0–100.0)
Platelets: 225 10*3/uL (ref 150–400)
RBC: 3.67 MIL/uL — ABNORMAL LOW (ref 3.87–5.11)
RDW: 13.5 % (ref 11.5–15.5)
WBC: 9.8 10*3/uL (ref 4.0–10.5)
nRBC: 0 % (ref 0.0–0.2)

## 2022-09-30 MED ORDER — IBUPROFEN 800 MG PO TABS
800.0000 mg | ORAL_TABLET | Freq: Once | ORAL | Status: AC
  Administered 2022-09-30: 800 mg via ORAL
  Filled 2022-09-30: qty 1

## 2022-09-30 MED ORDER — CYCLOBENZAPRINE HCL 10 MG PO TABS: 10.0000 mg | ORAL_TABLET | Freq: Two times a day (BID) | ORAL | 0 refills | Status: DC | PRN

## 2022-09-30 NOTE — MAU Note (Signed)
Onset of rt sided pain last night at 7,  abd is soft but tender to touch. No guarding.  Denies nausea, diarrhea, constipation and no urinary complaints.   "Feels like I pulled something".

## 2022-09-30 NOTE — MAU Note (Signed)
[name] is a 47 y.o. at [redacted]w[redacted]d here in MAU reporting: she's having constant sharp pain from her umbilicus to her right side that began last night.  Reports hasn't taken any med to treat.  Denies VB or LOF.  Reports +FM. LMP: NA Onset of complaint: yesterday Pain score: 10 Vitals:   09/30/22 1102  BP: 100/61  Pulse: 68  Resp: 18  Temp: 98.1 F (36.7 C)  SpO2: 99%     FHT: 155 bpm Lab orders placed from triage:   UA

## 2022-09-30 NOTE — Discharge Instructions (Signed)

## 2022-09-30 NOTE — MAU Provider Note (Signed)
History     CSN: [id number]  Arrival date and time: 09/30/22 1043   Event Date/Time   First Provider Initiated Contact with Patient 09/30/22 1129      Chief Complaint  Patient presents with   Abdominal Pain   HPI  [name] is a 47 y.o. G2P0010 at [redacted]w[redacted]d who presents for evaluation of abdominal pain. 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 She reports she got home and laid in a funny position when she was on the phone. When she tried to get up, she noticed her side was sore. She reports sitting up in the bed in MAU, the pain is gone.   She denies any vaginal bleeding, discharge, and leaking of fluid. Denies any constipation, diarrhea or any urinary complaints. Reports normal fetal movement.   OB History     Gravida  2   Para      Term      Preterm      AB  1   Living         SAB  1   IAB      Ectopic      Multiple      Live Births              Past Medical History:  Diagnosis Date   Asthma    as teenager   Depression    Eczema    Headache    migraines   Renal disorder    ? infection; has resolved per pt   Seasonal allergies    UTI (urinary tract infection)     Past Surgical History:  Procedure Laterality Date   MASS EXCISION Left 09/02/2016   Procedure: EXCISION LEFT GANGLION VOLAR CYST ON WRIST;  Surgeon: Cindee Salt, MD;  Location: [facility];  Service: Orthopedics;  Laterality: Left;   MOUTH SURGERY      Family History  Problem Relation Age of Onset   Cancer Mother    Cancer Father        pancreatic   Non-Hodgkin's lymphoma Father     Social History   Tobacco Use   Smoking status: Some Days    Packs/day: 0.25    Years: 10.00    Additional pack years: 0.00    Total pack years: 2.50    Types: Cigarettes   Smokeless tobacco: Never   Tobacco comments:    Trying to quit  Vaping Use   Vaping Use: Former  Substance Use Topics   Alcohol use: Not Currently     Comment: socially   Drug use: Yes    Types: Marijuana    Comment: medical marijuana for back and stress    Allergies:  Allergies  Allergen Reactions   Penicillins Anaphylaxis   Celery Oil    Latex Rash    itching    No medications prior to admission.    Review of Systems  Constitutional: Negative.  Negative for fatigue and fever.  HENT: Negative.    Respiratory: Negative.  Negative for shortness of breath.   Cardiovascular: Negative.  Negative for chest pain.  Gastrointestinal:  Positive for abdominal pain. Negative for constipation, diarrhea, nausea and vomiting.  Genitourinary: Negative.  Negative for dysuria, vaginal bleeding and vaginal discharge.  Neurological: Negative.  Negative for dizziness and headaches.   Physical Exam   Blood pressure 105/62, pulse 74, temperature 98.1 F (36.7 C), temperature source Oral, resp. rate 18, height  (1.6 m),  weight 62.5 kg, last menstrual period 05/23/2022, SpO2 99 %.  Patient Vitals for the past 24 hrs:  BP Temp Temp src Pulse Resp SpO2 Height Weight  09/30/22 1247 105/62 -- -- 74 -- -- -- --  09/30/22 1102 100/61 98.1 F (36.7 C) Oral 68 18 99 % -- --  09/30/22 1057 -- -- -- -- -- -- 5\' 3"  (1.6 m) 62.5 kg    Physical Exam Vitals and nursing note reviewed.  Constitutional:      General: She is not in acute distress.    Appearance: She is well-developed.  HENT:     Head: Normocephalic.  Eyes:     Pupils: Pupils are equal, round, and reactive to light.  Cardiovascular:     Rate and Rhythm: Normal rate and regular rhythm.     Heart sounds: Normal heart sounds.  Pulmonary:     Effort: Pulmonary effort is normal. No respiratory distress.     Breath sounds: Normal breath sounds.  Abdominal:     General: Bowel sounds are normal. There is no distension.     Palpations: Abdomen is soft.     Tenderness: There is no abdominal tenderness.  Skin:    General: Skin is warm and dry.  Neurological:     Mental Status: She is  alert and oriented to person, place, and time.  Psychiatric:        Mood and Affect: Mood normal.        Behavior: Behavior normal.        Thought Content: Thought content normal.        Judgment: Judgment normal.     FHT: 155 bpm  MAU Course  Procedures  Results for orders placed or performed during the hospital encounter of 09/30/22 (from the past 24 hour(s))  Urinalysis, Routine w reflex microscopic -Urine, Clean Catch     Status: Abnormal   Collection Time: 09/30/22 11:27 AM  Result Value Ref Range   Color, Urine YELLOW YELLOW   APPearance HAZY (A) CLEAR   Specific Gravity, Urine 1.021 1.005 - 1.030   pH 7.0 5.0 - 8.0   Glucose, UA NEGATIVE NEGATIVE mg/dL   Hgb urine dipstick NEGATIVE NEGATIVE   Bilirubin Urine NEGATIVE NEGATIVE   Ketones, ur NEGATIVE NEGATIVE mg/dL   Protein, ur NEGATIVE NEGATIVE mg/dL   Nitrite NEGATIVE NEGATIVE   Leukocytes,Ua NEGATIVE NEGATIVE  CBC     Status: Abnormal   Collection Time: 09/30/22 11:50 AM  Result Value Ref Range   WBC 9.8 4.0 - 10.5 K/uL   RBC 3.67 (L) 3.87 - 5.11 MIL/uL   Hemoglobin 11.4 (L) 12.0 - 15.0 g/dL   HCT 16.1 (L) 09.6 - 04.5 %   MCV 91.0 80.0 - 100.0 fL   MCH 31.1 26.0 - 34.0 pg   MCHC 34.1 30.0 - 36.0 g/dL   RDW 40.9 81.1 - 91.4 %   Platelets 225 150 - 400 K/uL   nRBC 0.0 0.0 - 0.2 %    MDM Labs ordered and reviewed.   UA CBC Ibuprofen  Patient reports resolution of pain Declines cervical exam and requested work note.   Assessment and Plan   1. Musculoskeletal pain   2. [redacted] weeks gestation of pregnancy     -Discharge home in stable condition -Rx for flexeril sent to pharmacy -Preterm labor precautions discussed -Patient advised to follow-up with OB as scheduled for prenatal care -Patient may return to MAU as needed or if her condition were to change or worsen  Rolm Bookbinder, CNM 09/30/2022, 11:29 AM

## 2022-10-04 ENCOUNTER — Other Ambulatory Visit: Payer: Self-pay | Admitting: *Deleted

## 2022-10-04 ENCOUNTER — Other Ambulatory Visit: Payer: Self-pay | Admitting: Obstetrics & Gynecology

## 2022-10-04 ENCOUNTER — Encounter: Payer: Self-pay | Admitting: *Deleted

## 2022-10-04 ENCOUNTER — Ambulatory Visit: Payer: 59 | Attending: Obstetrics & Gynecology

## 2022-10-04 ENCOUNTER — Ambulatory Visit: Payer: 59 | Admitting: *Deleted

## 2022-10-04 VITALS — BP 92/51 | HR 73

## 2022-10-04 DIAGNOSIS — O09522 Supervision of elderly multigravida, second trimester: Secondary | ICD-10-CM | POA: Diagnosis present

## 2022-10-04 DIAGNOSIS — O99332 Smoking (tobacco) complicating pregnancy, second trimester: Secondary | ICD-10-CM

## 2022-10-04 DIAGNOSIS — Z363 Encounter for antenatal screening for malformations: Secondary | ICD-10-CM | POA: Insufficient documentation

## 2022-10-04 DIAGNOSIS — F1910 Other psychoactive substance abuse, uncomplicated: Secondary | ICD-10-CM | POA: Insufficient documentation

## 2022-10-04 DIAGNOSIS — F1990 Other psychoactive substance use, unspecified, uncomplicated: Secondary | ICD-10-CM | POA: Diagnosis not present

## 2022-10-04 DIAGNOSIS — O99320 Drug use complicating pregnancy, unspecified trimester: Secondary | ICD-10-CM | POA: Insufficient documentation

## 2022-10-04 DIAGNOSIS — Z3A22 22 weeks gestation of pregnancy: Secondary | ICD-10-CM

## 2022-10-04 DIAGNOSIS — O99322 Drug use complicating pregnancy, second trimester: Secondary | ICD-10-CM

## 2022-10-04 DIAGNOSIS — Z148 Genetic carrier of other disease: Secondary | ICD-10-CM

## 2022-10-04 DIAGNOSIS — O99330 Smoking (tobacco) complicating pregnancy, unspecified trimester: Secondary | ICD-10-CM

## 2022-10-04 DIAGNOSIS — O285 Abnormal chromosomal and genetic finding on antenatal screening of mother: Secondary | ICD-10-CM

## 2022-11-03 ENCOUNTER — Ambulatory Visit: Payer: 59 | Admitting: *Deleted

## 2022-11-03 ENCOUNTER — Ambulatory Visit: Payer: 59 | Attending: Maternal & Fetal Medicine

## 2022-11-03 ENCOUNTER — Other Ambulatory Visit: Payer: Self-pay | Admitting: *Deleted

## 2022-11-03 VITALS — BP 92/57 | HR 68

## 2022-11-03 DIAGNOSIS — O09522 Supervision of elderly multigravida, second trimester: Secondary | ICD-10-CM

## 2022-11-03 DIAGNOSIS — Z3689 Encounter for other specified antenatal screening: Secondary | ICD-10-CM

## 2022-11-03 DIAGNOSIS — O280 Abnormal hematological finding on antenatal screening of mother: Secondary | ICD-10-CM

## 2022-11-03 DIAGNOSIS — F17210 Nicotine dependence, cigarettes, uncomplicated: Secondary | ICD-10-CM

## 2022-11-03 DIAGNOSIS — Z148 Genetic carrier of other disease: Secondary | ICD-10-CM

## 2022-11-03 DIAGNOSIS — O285 Abnormal chromosomal and genetic finding on antenatal screening of mother: Secondary | ICD-10-CM

## 2022-11-03 DIAGNOSIS — Z3A27 27 weeks gestation of pregnancy: Secondary | ICD-10-CM

## 2022-11-03 DIAGNOSIS — O99332 Smoking (tobacco) complicating pregnancy, second trimester: Secondary | ICD-10-CM

## 2022-11-03 DIAGNOSIS — F1910 Other psychoactive substance abuse, uncomplicated: Secondary | ICD-10-CM

## 2022-11-03 DIAGNOSIS — O99330 Smoking (tobacco) complicating pregnancy, unspecified trimester: Secondary | ICD-10-CM | POA: Insufficient documentation

## 2022-12-01 ENCOUNTER — Encounter: Payer: Self-pay | Admitting: Obstetrics and Gynecology

## 2022-12-08 ENCOUNTER — Other Ambulatory Visit: Payer: Self-pay | Admitting: Maternal & Fetal Medicine

## 2022-12-08 ENCOUNTER — Ambulatory Visit: Payer: Medicaid Other | Admitting: *Deleted

## 2022-12-08 ENCOUNTER — Other Ambulatory Visit: Payer: Self-pay | Admitting: *Deleted

## 2022-12-08 ENCOUNTER — Ambulatory Visit: Payer: Medicaid Other | Attending: Maternal & Fetal Medicine

## 2022-12-08 VITALS — BP 107/63 | HR 90

## 2022-12-08 DIAGNOSIS — O99333 Smoking (tobacco) complicating pregnancy, third trimester: Secondary | ICD-10-CM | POA: Diagnosis not present

## 2022-12-08 DIAGNOSIS — O99320 Drug use complicating pregnancy, unspecified trimester: Secondary | ICD-10-CM | POA: Insufficient documentation

## 2022-12-08 DIAGNOSIS — O99323 Drug use complicating pregnancy, third trimester: Secondary | ICD-10-CM | POA: Diagnosis not present

## 2022-12-08 DIAGNOSIS — Z3689 Encounter for other specified antenatal screening: Secondary | ICD-10-CM | POA: Diagnosis present

## 2022-12-08 DIAGNOSIS — F1910 Other psychoactive substance abuse, uncomplicated: Secondary | ICD-10-CM | POA: Diagnosis not present

## 2022-12-08 DIAGNOSIS — O09522 Supervision of elderly multigravida, second trimester: Secondary | ICD-10-CM

## 2022-12-08 DIAGNOSIS — O280 Abnormal hematological finding on antenatal screening of mother: Secondary | ICD-10-CM

## 2022-12-08 DIAGNOSIS — O99332 Smoking (tobacco) complicating pregnancy, second trimester: Secondary | ICD-10-CM

## 2022-12-08 DIAGNOSIS — O09523 Supervision of elderly multigravida, third trimester: Secondary | ICD-10-CM | POA: Diagnosis not present

## 2022-12-08 DIAGNOSIS — Z3A32 32 weeks gestation of pregnancy: Secondary | ICD-10-CM

## 2022-12-08 DIAGNOSIS — O285 Abnormal chromosomal and genetic finding on antenatal screening of mother: Secondary | ICD-10-CM

## 2022-12-08 DIAGNOSIS — F17210 Nicotine dependence, cigarettes, uncomplicated: Secondary | ICD-10-CM

## 2022-12-08 DIAGNOSIS — Z148 Genetic carrier of other disease: Secondary | ICD-10-CM

## 2022-12-15 ENCOUNTER — Ambulatory Visit: Payer: 59 | Attending: Obstetrics

## 2022-12-15 ENCOUNTER — Ambulatory Visit: Payer: 59

## 2022-12-15 VITALS — BP 100/67 | HR 78

## 2022-12-15 DIAGNOSIS — F17210 Nicotine dependence, cigarettes, uncomplicated: Secondary | ICD-10-CM | POA: Diagnosis not present

## 2022-12-15 DIAGNOSIS — O09523 Supervision of elderly multigravida, third trimester: Secondary | ICD-10-CM | POA: Diagnosis not present

## 2022-12-15 DIAGNOSIS — Z3689 Encounter for other specified antenatal screening: Secondary | ICD-10-CM | POA: Insufficient documentation

## 2022-12-15 DIAGNOSIS — Z3A33 33 weeks gestation of pregnancy: Secondary | ICD-10-CM

## 2022-12-15 DIAGNOSIS — O99333 Smoking (tobacco) complicating pregnancy, third trimester: Secondary | ICD-10-CM

## 2022-12-15 DIAGNOSIS — O285 Abnormal chromosomal and genetic finding on antenatal screening of mother: Secondary | ICD-10-CM | POA: Diagnosis not present

## 2022-12-15 DIAGNOSIS — Z148 Genetic carrier of other disease: Secondary | ICD-10-CM

## 2022-12-21 ENCOUNTER — Ambulatory Visit: Payer: Medicaid Other

## 2022-12-21 ENCOUNTER — Ambulatory Visit: Payer: Medicaid Other | Attending: Obstetrics

## 2022-12-21 VITALS — BP 112/57 | HR 83

## 2022-12-21 DIAGNOSIS — O99333 Smoking (tobacco) complicating pregnancy, third trimester: Secondary | ICD-10-CM | POA: Diagnosis not present

## 2022-12-21 DIAGNOSIS — O99323 Drug use complicating pregnancy, third trimester: Secondary | ICD-10-CM

## 2022-12-21 DIAGNOSIS — O99332 Smoking (tobacco) complicating pregnancy, second trimester: Secondary | ICD-10-CM | POA: Insufficient documentation

## 2022-12-21 DIAGNOSIS — O99320 Drug use complicating pregnancy, unspecified trimester: Secondary | ICD-10-CM | POA: Diagnosis present

## 2022-12-21 DIAGNOSIS — Z3689 Encounter for other specified antenatal screening: Secondary | ICD-10-CM | POA: Insufficient documentation

## 2022-12-21 DIAGNOSIS — F1910 Other psychoactive substance abuse, uncomplicated: Secondary | ICD-10-CM | POA: Diagnosis present

## 2022-12-21 DIAGNOSIS — F17210 Nicotine dependence, cigarettes, uncomplicated: Secondary | ICD-10-CM

## 2022-12-21 DIAGNOSIS — O285 Abnormal chromosomal and genetic finding on antenatal screening of mother: Secondary | ICD-10-CM

## 2022-12-21 DIAGNOSIS — O09523 Supervision of elderly multigravida, third trimester: Secondary | ICD-10-CM | POA: Diagnosis not present

## 2022-12-21 DIAGNOSIS — Z148 Genetic carrier of other disease: Secondary | ICD-10-CM

## 2022-12-21 DIAGNOSIS — F1290 Cannabis use, unspecified, uncomplicated: Secondary | ICD-10-CM

## 2022-12-21 DIAGNOSIS — Z3A34 34 weeks gestation of pregnancy: Secondary | ICD-10-CM

## 2022-12-30 ENCOUNTER — Ambulatory Visit: Payer: Medicaid Other

## 2022-12-30 ENCOUNTER — Ambulatory Visit: Payer: Medicaid Other | Attending: Obstetrics

## 2022-12-30 VITALS — BP 116/79 | HR 76

## 2022-12-30 DIAGNOSIS — O09523 Supervision of elderly multigravida, third trimester: Secondary | ICD-10-CM | POA: Diagnosis not present

## 2022-12-30 DIAGNOSIS — O99323 Drug use complicating pregnancy, third trimester: Secondary | ICD-10-CM

## 2022-12-30 DIAGNOSIS — O285 Abnormal chromosomal and genetic finding on antenatal screening of mother: Secondary | ICD-10-CM

## 2022-12-30 DIAGNOSIS — F1290 Cannabis use, unspecified, uncomplicated: Secondary | ICD-10-CM

## 2022-12-30 DIAGNOSIS — F17210 Nicotine dependence, cigarettes, uncomplicated: Secondary | ICD-10-CM

## 2022-12-30 DIAGNOSIS — O99333 Smoking (tobacco) complicating pregnancy, third trimester: Secondary | ICD-10-CM | POA: Diagnosis not present

## 2022-12-30 DIAGNOSIS — Z3689 Encounter for other specified antenatal screening: Secondary | ICD-10-CM | POA: Insufficient documentation

## 2022-12-30 DIAGNOSIS — Z3A35 35 weeks gestation of pregnancy: Secondary | ICD-10-CM

## 2022-12-30 DIAGNOSIS — Z148 Genetic carrier of other disease: Secondary | ICD-10-CM

## 2023-01-04 ENCOUNTER — Ambulatory Visit: Payer: Medicaid Other

## 2023-01-04 ENCOUNTER — Telehealth: Payer: Self-pay

## 2023-01-04 NOTE — Telephone Encounter (Signed)
Has appt scheduled today for RSV vaccine; pt [redacted] weeks gestation.  Informed pt [facility] administers RSV vaccine for pregnant pts 32-[redacted] weeks gestation beginning in September.  Pt states [place] has it available 02/05/23 but this is past her due date.  Cherlynn Polo, RN

## 2023-01-05 ENCOUNTER — Ambulatory Visit: Payer: Medicaid Other | Attending: Maternal & Fetal Medicine

## 2023-01-05 ENCOUNTER — Ambulatory Visit: Payer: Medicaid Other | Admitting: *Deleted

## 2023-01-05 VITALS — BP 119/64 | HR 88

## 2023-01-05 DIAGNOSIS — Z3689 Encounter for other specified antenatal screening: Secondary | ICD-10-CM | POA: Insufficient documentation

## 2023-01-05 DIAGNOSIS — O99333 Smoking (tobacco) complicating pregnancy, third trimester: Secondary | ICD-10-CM | POA: Diagnosis not present

## 2023-01-05 DIAGNOSIS — F17210 Nicotine dependence, cigarettes, uncomplicated: Secondary | ICD-10-CM

## 2023-01-05 DIAGNOSIS — O09523 Supervision of elderly multigravida, third trimester: Secondary | ICD-10-CM | POA: Insufficient documentation

## 2023-01-05 DIAGNOSIS — F1290 Cannabis use, unspecified, uncomplicated: Secondary | ICD-10-CM

## 2023-01-05 DIAGNOSIS — Z148 Genetic carrier of other disease: Secondary | ICD-10-CM

## 2023-01-05 DIAGNOSIS — O285 Abnormal chromosomal and genetic finding on antenatal screening of mother: Secondary | ICD-10-CM

## 2023-01-05 DIAGNOSIS — O99323 Drug use complicating pregnancy, third trimester: Secondary | ICD-10-CM

## 2023-01-05 DIAGNOSIS — Z3A36 36 weeks gestation of pregnancy: Secondary | ICD-10-CM

## 2023-01-10 LAB — OB RESULTS CONSOLE GBS: GBS: NEGATIVE

## 2023-01-12 ENCOUNTER — Ambulatory Visit: Payer: Medicaid Other | Attending: Maternal & Fetal Medicine

## 2023-01-12 ENCOUNTER — Other Ambulatory Visit: Payer: Self-pay | Admitting: *Deleted

## 2023-01-12 ENCOUNTER — Ambulatory Visit: Payer: Medicaid Other

## 2023-01-12 VITALS — BP 116/66 | HR 80

## 2023-01-12 DIAGNOSIS — O99320 Drug use complicating pregnancy, unspecified trimester: Secondary | ICD-10-CM | POA: Diagnosis present

## 2023-01-12 DIAGNOSIS — F1910 Other psychoactive substance abuse, uncomplicated: Secondary | ICD-10-CM

## 2023-01-12 DIAGNOSIS — Z3A37 37 weeks gestation of pregnancy: Secondary | ICD-10-CM

## 2023-01-12 DIAGNOSIS — O99333 Smoking (tobacco) complicating pregnancy, third trimester: Secondary | ICD-10-CM | POA: Diagnosis not present

## 2023-01-12 DIAGNOSIS — O99323 Drug use complicating pregnancy, third trimester: Secondary | ICD-10-CM

## 2023-01-12 DIAGNOSIS — O09523 Supervision of elderly multigravida, third trimester: Secondary | ICD-10-CM | POA: Diagnosis present

## 2023-01-12 DIAGNOSIS — F1290 Cannabis use, unspecified, uncomplicated: Secondary | ICD-10-CM | POA: Diagnosis not present

## 2023-01-12 DIAGNOSIS — Z3689 Encounter for other specified antenatal screening: Secondary | ICD-10-CM | POA: Diagnosis present

## 2023-01-12 DIAGNOSIS — Z148 Genetic carrier of other disease: Secondary | ICD-10-CM

## 2023-01-12 DIAGNOSIS — F17210 Nicotine dependence, cigarettes, uncomplicated: Secondary | ICD-10-CM | POA: Diagnosis not present

## 2023-01-12 DIAGNOSIS — O285 Abnormal chromosomal and genetic finding on antenatal screening of mother: Secondary | ICD-10-CM

## 2023-01-19 ENCOUNTER — Ambulatory Visit: Payer: Medicaid Other | Attending: Obstetrics and Gynecology | Admitting: *Deleted

## 2023-01-19 DIAGNOSIS — O09523 Supervision of elderly multigravida, third trimester: Secondary | ICD-10-CM | POA: Diagnosis present

## 2023-01-19 DIAGNOSIS — Z3A38 38 weeks gestation of pregnancy: Secondary | ICD-10-CM | POA: Diagnosis not present

## 2023-01-19 NOTE — Procedures (Signed)
Takhia K Padget Feb 14, 1976 [redacted]w[redacted]d  Fetus A Non-Stress Test Interpretation for 01/19/23-NST only  Indication: Advanced Maternal Age >40 years  Fetal Heart Rate A Mode: External Baseline Rate (A): 130 bpm Variability: Moderate Accelerations: 15 x 15 Decelerations: None Multiple birth?: No  Uterine Activity Mode: Toco Contraction Frequency (min): none Resting Tone Palpated: Relaxed  Interpretation (Fetal Testing) Nonstress Test Interpretation: Reactive Comments: Tracing reviewed byDr. Grace Bushy

## 2023-01-26 ENCOUNTER — Ambulatory Visit: Payer: Medicaid Other | Attending: Maternal & Fetal Medicine | Admitting: *Deleted

## 2023-01-26 ENCOUNTER — Other Ambulatory Visit: Payer: Self-pay | Admitting: Advanced Practice Midwife

## 2023-01-26 DIAGNOSIS — Z3A39 39 weeks gestation of pregnancy: Secondary | ICD-10-CM | POA: Diagnosis not present

## 2023-01-26 DIAGNOSIS — O99333 Smoking (tobacco) complicating pregnancy, third trimester: Secondary | ICD-10-CM | POA: Diagnosis not present

## 2023-01-26 DIAGNOSIS — F17200 Nicotine dependence, unspecified, uncomplicated: Secondary | ICD-10-CM

## 2023-01-26 DIAGNOSIS — O09513 Supervision of elderly primigravida, third trimester: Secondary | ICD-10-CM

## 2023-01-26 DIAGNOSIS — O09523 Supervision of elderly multigravida, third trimester: Secondary | ICD-10-CM | POA: Diagnosis present

## 2023-01-26 NOTE — Procedures (Signed)
[name] 01/08/1976 [redacted]w[redacted]d  Fetus A Non-Stress Test Interpretation for 01/26/23-NST only  Indication: Advanced Maternal Age >40 years  Fetal Heart Rate A Mode: External Baseline Rate (A): 135 bpm Variability: Moderate Accelerations: 15 x 15 Decelerations: None Multiple birth?: No  Uterine Activity Mode: Toco Contraction Frequency (min): occas Contraction Duration (sec): 60 Contraction Quality: Mild (not felt by pt)  Interpretation (Fetal Testing) Nonstress Test Interpretation: Reactive Comments: Tracing reviewed byDr. Judeth Cornfield

## 2023-01-28 ENCOUNTER — Telehealth (HOSPITAL_COMMUNITY): Payer: Self-pay | Admitting: *Deleted

## 2023-01-28 ENCOUNTER — Encounter (HOSPITAL_COMMUNITY): Payer: Self-pay | Admitting: *Deleted

## 2023-01-28 NOTE — Telephone Encounter (Signed)
Preadmission screen  

## 2023-02-01 ENCOUNTER — Inpatient Hospital Stay (HOSPITAL_COMMUNITY): Payer: Medicaid Other | Admitting: Anesthesiology

## 2023-02-01 ENCOUNTER — Other Ambulatory Visit: Payer: Self-pay

## 2023-02-01 ENCOUNTER — Inpatient Hospital Stay (HOSPITAL_COMMUNITY)
Admission: RE | Admit: 2023-02-01 | Discharge: 2023-02-04 | DRG: 807 | Disposition: A | Discharge status: Home / Self Care | Payer: Medicaid Other | Attending: Obstetrics and Gynecology | Admitting: Obstetrics and Gynecology

## 2023-02-01 ENCOUNTER — Inpatient Hospital Stay (HOSPITAL_COMMUNITY): Payer: Medicaid Other

## 2023-02-01 ENCOUNTER — Encounter (HOSPITAL_COMMUNITY): Payer: Self-pay | Admitting: Family Medicine

## 2023-02-01 DIAGNOSIS — Z3A40 40 weeks gestation of pregnancy: Secondary | ICD-10-CM | POA: Diagnosis not present

## 2023-02-01 DIAGNOSIS — F1910 Other psychoactive substance abuse, uncomplicated: Secondary | ICD-10-CM | POA: Diagnosis present

## 2023-02-01 DIAGNOSIS — O09513 Supervision of elderly primigravida, third trimester: Secondary | ICD-10-CM | POA: Diagnosis present

## 2023-02-01 DIAGNOSIS — Z91040 Latex allergy status: Secondary | ICD-10-CM

## 2023-02-01 DIAGNOSIS — O99334 Smoking (tobacco) complicating childbirth: Secondary | ICD-10-CM | POA: Diagnosis present

## 2023-02-01 DIAGNOSIS — O9962 Diseases of the digestive system complicating childbirth: Secondary | ICD-10-CM | POA: Diagnosis present

## 2023-02-01 DIAGNOSIS — Z880 Allergy status to penicillin: Secondary | ICD-10-CM | POA: Diagnosis not present

## 2023-02-01 DIAGNOSIS — O09529 Supervision of elderly multigravida, unspecified trimester: Secondary | ICD-10-CM

## 2023-02-01 DIAGNOSIS — O480 Post-term pregnancy: Secondary | ICD-10-CM | POA: Diagnosis not present

## 2023-02-01 DIAGNOSIS — O99330 Smoking (tobacco) complicating pregnancy, unspecified trimester: Secondary | ICD-10-CM | POA: Diagnosis present

## 2023-02-01 DIAGNOSIS — K469 Unspecified abdominal hernia without obstruction or gangrene: Secondary | ICD-10-CM | POA: Diagnosis present

## 2023-02-01 DIAGNOSIS — O99320 Drug use complicating pregnancy, unspecified trimester: Secondary | ICD-10-CM | POA: Diagnosis present

## 2023-02-01 DIAGNOSIS — O26893 Other specified pregnancy related conditions, third trimester: Secondary | ICD-10-CM | POA: Diagnosis present

## 2023-02-01 DIAGNOSIS — O99820 Streptococcus B carrier state complicating pregnancy: Secondary | ICD-10-CM | POA: Diagnosis not present

## 2023-02-01 DIAGNOSIS — O99824 Streptococcus B carrier state complicating childbirth: Principal | ICD-10-CM | POA: Diagnosis present

## 2023-02-01 DIAGNOSIS — F17210 Nicotine dependence, cigarettes, uncomplicated: Secondary | ICD-10-CM | POA: Diagnosis present

## 2023-02-01 DIAGNOSIS — O09523 Supervision of elderly multigravida, third trimester: Secondary | ICD-10-CM | POA: Diagnosis not present

## 2023-02-01 LAB — CBC
HCT: 39.1 % (ref 36.0–46.0)
Hemoglobin: 12.9 g/dL (ref 12.0–15.0)
MCH: 30.3 pg (ref 26.0–34.0)
MCHC: 33 g/dL (ref 30.0–36.0)
MCV: 91.8 fL (ref 80.0–100.0)
Platelets: 200 10*3/uL (ref 150–400)
RBC: 4.26 MIL/uL (ref 3.87–5.11)
RDW: 13.9 % (ref 11.5–15.5)
WBC: 9.1 10*3/uL (ref 4.0–10.5)
nRBC: 0 % (ref 0.0–0.2)

## 2023-02-01 LAB — TYPE AND SCREEN
ABO/RH(D): A POS
Antibody Screen: NEGATIVE

## 2023-02-01 LAB — RPR: RPR Ser Ql: NONREACTIVE

## 2023-02-01 MED ORDER — PHENYLEPHRINE 80 MCG/ML (10ML) SYRINGE FOR IV PUSH (FOR BLOOD PRESSURE SUPPORT): 80.0000 ug | PREFILLED_SYRINGE | INTRAVENOUS | Status: DC | PRN

## 2023-02-01 MED ORDER — EPHEDRINE 5 MG/ML INJ: 10.0000 mg | INTRAVENOUS | Status: DC | PRN

## 2023-02-01 MED ORDER — FENTANYL-BUPIVACAINE-NACL 0.5-0.125-0.9 MG/250ML-% EP SOLN
12.0000 mL/h | EPIDURAL | Status: DC | PRN
  Administered 2023-02-01: 12 mL/h via EPIDURAL
  Filled 2023-02-01: qty 250

## 2023-02-01 MED ORDER — ONDANSETRON HCL 4 MG/2ML IJ SOLN
4.0000 mg | Freq: Four times a day (QID) | INTRAMUSCULAR | Status: DC | PRN
  Administered 2023-02-01 – 2023-02-02 (×3): 4 mg via INTRAVENOUS
  Filled 2023-02-01 (×3): qty 2

## 2023-02-01 MED ORDER — SOD CITRATE-CITRIC ACID 500-334 MG/5ML PO SOLN: 30.0000 mL | ORAL | Status: DC | PRN

## 2023-02-01 MED ORDER — ACETAMINOPHEN 325 MG PO TABS: 650.0000 mg | ORAL_TABLET | ORAL | Status: DC | PRN

## 2023-02-01 MED ORDER — DIPHENHYDRAMINE HCL 50 MG/ML IJ SOLN: 12.5000 mg | INTRAMUSCULAR | Status: DC | PRN

## 2023-02-01 MED ORDER — LIDOCAINE HCL (PF) 1 % IJ SOLN
INTRAMUSCULAR | Status: DC | PRN
  Administered 2023-02-01 (×2): 4 mL via EPIDURAL

## 2023-02-01 MED ORDER — TERBUTALINE SULFATE 1 MG/ML IJ SOLN: 0.2500 mg | Freq: Once | INTRAMUSCULAR | Status: DC | PRN

## 2023-02-01 MED ORDER — LACTATED RINGERS IV SOLN: INTRAVENOUS | Status: DC

## 2023-02-01 MED ORDER — LIDOCAINE HCL (PF) 1 % IJ SOLN: 30.0000 mL | INTRAMUSCULAR | Status: DC | PRN

## 2023-02-01 MED ORDER — MISOPROSTOL 25 MCG QUARTER TABLET
25.0000 ug | ORAL_TABLET | Freq: Once | ORAL | Status: AC
  Administered 2023-02-01: 25 ug via VAGINAL
  Filled 2023-02-01: qty 1

## 2023-02-01 MED ORDER — FENTANYL CITRATE (PF) 100 MCG/2ML IJ SOLN
100.0000 ug | INTRAMUSCULAR | Status: DC | PRN
  Administered 2023-02-01: 100 ug via INTRAVENOUS
  Filled 2023-02-01: qty 2

## 2023-02-01 MED ORDER — OXYCODONE-ACETAMINOPHEN 5-325 MG PO TABS: 1.0000 | ORAL_TABLET | ORAL | Status: DC | PRN

## 2023-02-01 MED ORDER — OXYTOCIN-SODIUM CHLORIDE 30-0.9 UT/500ML-% IV SOLN
2.5000 [IU]/h | INTRAVENOUS | Status: DC
  Filled 2023-02-01: qty 500

## 2023-02-01 MED ORDER — MISOPROSTOL 50MCG HALF TABLET
50.0000 ug | ORAL_TABLET | Freq: Once | ORAL | Status: AC
  Administered 2023-02-01: 50 ug via ORAL
  Filled 2023-02-01: qty 1

## 2023-02-01 MED ORDER — OXYCODONE-ACETAMINOPHEN 5-325 MG PO TABS: 2.0000 | ORAL_TABLET | ORAL | Status: DC | PRN

## 2023-02-01 MED ORDER — LACTATED RINGERS IV SOLN: 500.0000 mL | INTRAVENOUS | Status: DC | PRN

## 2023-02-01 MED ORDER — LACTATED RINGERS IV SOLN: 500.0000 mL | Freq: Once | INTRAVENOUS | Status: DC

## 2023-02-01 MED ORDER — OXYTOCIN BOLUS FROM INFUSION
333.0000 mL | Freq: Once | INTRAVENOUS | Status: AC
  Administered 2023-02-02: 333 mL via INTRAVENOUS

## 2023-02-01 NOTE — Anesthesia Preprocedure Evaluation (Signed)
Anesthesia Evaluation  Patient identified by MRN, date of birth, ID band Patient awake    Reviewed: Allergy & Precautions, Patient's Chart, lab work & pertinent test results  History of Anesthesia Complications Negative for: history of anesthetic complications  Airway Mallampati: II  TM Distance: >3 FB Neck ROM: Full    Dental no notable dental hx.    Pulmonary asthma , Current Smoker and Patient abstained from smoking.   Pulmonary exam normal        Cardiovascular negative cardio ROS Normal cardiovascular exam     Neuro/Psych  Headaches   Depression       GI/Hepatic negative GI ROS, Neg liver ROS,,,  Endo/Other  negative endocrine ROS    Renal/GU negative Renal ROS     Musculoskeletal negative musculoskeletal ROS (+)    Abdominal   Peds  Hematology negative hematology ROS (+)   Anesthesia Other Findings Day of surgery medications reviewed with patient.  Reproductive/Obstetrics (+) Pregnancy                              Anesthesia Physical Anesthesia Plan  ASA: 2  Anesthesia Plan: Epidural   Post-op Pain Management:    Induction:   PONV Risk Score and Plan: Treatment may vary due to age or medical condition  Airway Management Planned: Natural Airway  Additional Equipment: Fetal Monitoring  Intra-op Plan:   Post-operative Plan:   Informed Consent: I have reviewed the patients History and Physical, chart, labs and discussed the procedure including the risks, benefits and alternatives for the proposed anesthesia with the patient or authorized representative who has indicated his/her understanding and acceptance.       Plan Discussed with:   Anesthesia Plan Comments:          Anesthesia Quick Evaluation

## 2023-02-01 NOTE — Progress Notes (Signed)
Labor Progress Note [name] is a 47 y.o. G2P0010 at [redacted]w[redacted]d presented for IOL due to [facility] S: pain improved with IV fentanyl, tolerating contractions well  O:  BP 136/72   Pulse 72   Temp 98.3 F (36.8 C) (Oral)   Resp 18   Ht 5\' 3"  (1.6 m)   Wt 77.6 kg   LMP 05/23/2022   BMI 30.29 kg/m  EFM: 125/moderate variability/accels/no decels  CVE: Dilation: 1.5 Effacement (%): 50 Cervical Position: Posterior Station: -1 Presentation: Vertex Exam by:: Khori Underberg MD   A&P: 47 y.o. G2P0010 [redacted]w[redacted]d admitted for IOL due to AMA #Labor: Progressing well.  #Pain: Poorly controlled with IV fentanyl, patient desires epidural #FWB: Cat I #GBS negative   Wyn Forster, MD 3:27 PM

## 2023-02-01 NOTE — Progress Notes (Signed)
LABOR PROGRESS NOTE  [name] is a 47 y.o. G2P0010 at [redacted]w[redacted]d presented for IOL for AMA.  S: Comfortable with epidural. Feeling nauseous.  O:  BP 109/77   Pulse 89   Temp 98.2 F (36.8 C) (Axillary)   Resp 18   Ht 5\' 3"  (1.6 m)   Wt 77.6 kg   LMP 05/23/2022   SpO2 100%   BMI 30.29 kg/m  EFM:130 bpm/Moderate variability/ 15x15 accels/ None decels CAT: 1 Toco: regular, every 2 minutes   CVE: Dilation: 4 Effacement (%): 70 Cervical Position: Middle Station: -1 Presentation: Vertex Exam by:: Chelsea RN   A&P: 47 y.o. G2P0010 [redacted]w[redacted]d here for IOL as above  #Labor: Progressing well after 2 doses of cytotec. Suspect starting to transition. Will plan for cervix check in next 1-2 hours to determine it pit will be needed to continue induction #Pain: Epidural #FWB: CAT 1 #GBS negative  Joanne Gavel, MD FMOB Fellow, Faculty practice [facility], Center for [facility] 02/01/23  8:59 PM

## 2023-02-01 NOTE — Anesthesia Procedure Notes (Signed)
Epidural Patient location during procedure: OB Start time: 02/01/2023 3:58 PM End time: 02/01/2023 4:01 PM  Staffing Anesthesiologist: Kaylyn Layer, MD Performed: anesthesiologist   Preanesthetic Checklist Completed: patient identified, IV checked, risks and benefits discussed, monitors and equipment checked, pre-op evaluation and timeout performed  Epidural Patient position: sitting Prep: DuraPrep and site prepped and draped Patient monitoring: continuous pulse ox, blood pressure and heart rate Approach: midline Location: L3-L4 Injection technique: LOR air  Needle:  Needle type: Tuohy  Needle gauge: 17 G Needle length: 9 cm Needle insertion depth: 5 cm Catheter type: closed end flexible Catheter size: 19 Gauge Catheter at skin depth: 10 cm Test dose: negative and Other (1% lidocaine)  Assessment Events: blood not aspirated, no cerebrospinal fluid, injection not painful, no injection resistance, no paresthesia and negative IV test  Additional Notes Patient identified. Risks, benefits, and alternatives discussed with patient including but not limited to bleeding, infection, nerve damage, paralysis, failed block, incomplete pain control, headache, blood pressure changes, nausea, vomiting, reactions to medication, itching, and postpartum back pain. Confirmed with bedside nurse the patient's most recent platelet count. Confirmed with patient that they are not currently taking any anticoagulation, have any bleeding history, or any family history of bleeding disorders. Patient expressed understanding and wished to proceed. All questions were answered. Sterile technique was used throughout the entire procedure. Please see nursing notes for vital signs.   Crisp LOR on first pass. Test dose was given through epidural catheter and negative prior to continuing to dose epidural or start infusion. Warning signs of high block given to the patient including shortness of breath,  tingling/numbness in hands, complete motor block, or any concerning symptoms with instructions to call for help. Patient was given instructions on fall risk and not to get out of bed. All questions and concerns addressed with instructions to call with any issues or inadequate analgesia.  Reason for block:procedure for pain

## 2023-02-01 NOTE — H&P (Addendum)
OBSTETRIC ADMISSION HISTORY AND PHYSICAL  Alexies Jetter Cayson is a 47 y.o. female G2P0010 with IUP at [redacted]w[redacted]d by [place] presenting for IOL due to AMA. She reports +FMs, No LOF, no VB, no blurry vision, headaches or peripheral edema, and RUQ pain.  She plans on breast feeding. She is undecided for birth control. She received her prenatal care at [facility]   Dating: By [place] --->  Estimated Date of Delivery: 02/01/23  Sono:   @[redacted]w[redacted]d , : cephalic EFW: 2571 gm 5 lb 11 oz 22 %  anterior placenta  Prenatal History/Complications:  Tobacco use during pregnancy - stopped in March AMA BMI 30  Past Medical History: Past Medical History:  Diagnosis Date   Asthma    as teenager   Complication of anesthesia    trouble waking up   Depression    Eczema    Family history of adverse reaction to anesthesia    mom allergic to anesthesia pt does not know information and her mother is deceased   Headache    migraines   Renal disorder    ? infection; has resolved per pt   Seasonal allergies    UTI (urinary tract infection)     Past Surgical History: Past Surgical History:  Procedure Laterality Date   MASS EXCISION Left 09/02/2016   Procedure: EXCISION LEFT GANGLION VOLAR CYST ON WRIST;  Surgeon: Cindee Salt, MD;  Location: [facility];  Service: Orthopedics;  Laterality: Left;   MOUTH SURGERY      Obstetrical History: OB History     Gravida  2   Para      Term      Preterm      AB  1   Living         SAB  1   IAB      Ectopic      Multiple      Live Births              Social History Social History   Socioeconomic History   Marital status: Single    Spouse name: Not on file   Number of children: Not on file   Years of education: Not on file   Highest education level: Not on file  Occupational History   Not on file  Tobacco Use   Smoking status: Some Days    Average packs/day: 0.3 packs/day for 10.1 years (2.5 ttl pk-yrs)    Types: Cigarettes    Start  date: 2014   Smokeless tobacco: Never   Tobacco comments:    Trying to quit  Vaping Use   Vaping status: Former   Substances: CBD  Substance and Sexual Activity   Alcohol use: Not Currently    Comment: socially   Drug use: Yes    Types: Marijuana, Cocaine    Comment: medical marijuana for back and stress   Sexual activity: Not Currently    Birth control/protection: None    Comment: Depo - provera injection  Other Topics Concern   Not on file  Social History Narrative   Not on file   Social Determinants of Health   Financial Resource Strain: Not on file  Food Insecurity: No Food Insecurity (02/01/2023)   Hunger Vital Sign    Worried About Running Out of Food in the Last Year: Never true    Ran Out of Food in the Last Year: Never true  Transportation Needs: No Transportation Needs (02/01/2023)   PRAPARE - Transportation  Lack of Transportation (Medical): No    Lack of Transportation (Non-Medical): No  Physical Activity: Not on file  Stress: Not on file  Social Connections: Not on file    Family History: Family History  Problem Relation Age of Onset   Cancer Mother    Cancer Father        pancreatic   Non-Hodgkin's lymphoma Father    Heart disease Paternal Grandmother     Allergies: Allergies  Allergen Reactions   Penicillins Anaphylaxis   Celery Oil    Latex Rash    itching    Medications Prior to Admission  Medication Sig Dispense Refill Last Dose   acetaminophen (TYLENOL) 500 MG tablet Take 1,000 mg by mouth every 6 (six) hours as needed for moderate pain. (Patient not taking: Reported on 11/03/2022)      aspirin EC 81 MG tablet Take 81 mg by mouth daily. Swallow whole.      BIOTIN PO Take 1 tablet by mouth daily.      cyclobenzaprine (FLEXERIL) 10 MG tablet Take 1 tablet (10 mg total) by mouth 2 (two) times daily as needed for muscle spasms. (Patient not taking: Reported on 11/03/2022) 20 tablet 0    Echinacea 125 MG CAPS Take 1 capsule by mouth daily.  (Patient not taking: Reported on 10/04/2022)      Prenatal Vit-Fe Fumarate-FA (PREPLUS) 27-1 MG TABS Take 1 tablet by mouth daily. 30 tablet 13      Review of Systems   All systems reviewed and negative except as stated in HPI  Blood pressure 121/73, pulse 78, temperature 98.3 F (36.8 C), temperature source Oral, resp. rate 18, height 5\' 3"  (1.6 m), weight 77.6 kg, last menstrual period 05/23/2022. General appearance: alert, cooperative, appears stated age, and no distress Lungs: clear to auscultation bilaterally Heart: regular rate and rhythm Abdomen: soft, non-tender; bowel sounds normal Extremities: Homans sign is negative, no sign of DVT Presentation: cephalic Fetal monitoringBaseline: 140 bpm, Variability: Fair (1-6 bpm), Accelerations: Reactive, Decelerations: Absent, and Cat I Uterine activityNone Dilation: 1 Effacement (%): 50 Station: -3 Exam by:: Hubert Azure RNC   Prenatal labs: ABO, Rh: --/--/A POS (08/27 0915) Antibody: NEG (08/27 0915) Rubella: Immune (02/29 0000) RPR: Nonreactive (02/29 0000)  HBsAg: Negative (02/29 0000)  HIV: Non-reactive (02/29 0000)  GBS: Negative/-- (08/05 0000)  1 hr Glucola nl Genetic screening  LR NIPS, neg AFP Anatomy US nl  Prenatal Transfer Tool  Maternal Diabetes: No Genetic Screening: Normal Maternal Ultrasounds/Referrals: Normal Fetal Ultrasounds or other Referrals:  None Maternal Substance Abuse:  Yes:  Type: Smoker Significant Maternal Medications:  None Significant Maternal Lab Results:  None and Group B Strep positive Number of Prenatal Visits:greater than 3 verified prenatal visits Other Comments:  None  Results for orders placed or performed during the hospital encounter of 02/01/23 (from the past 24 hour(s))  Type and screen   Collection Time: 02/01/23  9:15 AM  Result Value Ref Range   ABO/RH(D) A POS    Antibody Screen NEG    Sample Expiration      02/04/2023,2359 Performed at [facility], [address] [place], [place] [address]   CBC   Collection Time: 02/01/23  9:30 AM  Result Value Ref Range   WBC 9.1 4.0 - 10.5 K/uL   RBC 4.26 3.87 - 5.11 MIL/uL   Hemoglobin 12.9 12.0 - 15.0 g/dL   HCT 86.5 78.4 - 69.6 %   MCV 91.8 80.0 - 100.0 fL  MCH 30.3 26.0 - 34.0 pg   MCHC 33.0 30.0 - 36.0 g/dL   RDW 16.1 09.6 - 04.5 %   Platelets 200 150 - 400 K/uL   nRBC 0.0 0.0 - 0.2 %    Patient Active Problem List   Diagnosis Date Noted   AMA (advanced maternal age) primigravida 68+, third trimester 02/01/2023   AMA (advanced maternal age) multigravida 35+ 09/30/2022   Tobacco use complicating pregnancy 09/30/2022   Substance abuse affecting pregnancy, antepartum (HCC) 09/30/2022    Assessment/Plan:  Cesley Braccia Mcbroom is a 47 y.o. G2P0010 at [redacted]w[redacted]d here for IOL term, AMA  #Labor:IOL: s/p rectal and buccal Cytotec @ 0930 #Pain: epidural #FWB: Cat I #ID:  GBS neg #MOF: breast #MOC: undecided, declines ppIUD #Circ:  yes  Paschal Dopp, MD  02/01/2023, 12:26 PM  GME ATTESTATION:  Evaluation and management procedures were performed by the Family Medicine Resident under my supervision. I was immediately available for direct supervision, assistance and direction throughout this encounter.  I also confirm that I have verified the information documented in the resident's note, and that I have also personally reperformed the pertinent components of the physical exam and all of the medical decision making activities.  I have also made any necessary editorial changes.  Wyn Forster, MD OB Fellow, Faculty Practice [facility], Center for [facility] 02/01/2023 1:13 PM

## 2023-02-02 ENCOUNTER — Encounter (HOSPITAL_COMMUNITY): Payer: Self-pay | Admitting: Family Medicine

## 2023-02-02 DIAGNOSIS — O480 Post-term pregnancy: Secondary | ICD-10-CM

## 2023-02-02 DIAGNOSIS — O99334 Smoking (tobacco) complicating childbirth: Secondary | ICD-10-CM

## 2023-02-02 DIAGNOSIS — Z3A40 40 weeks gestation of pregnancy: Secondary | ICD-10-CM

## 2023-02-02 DIAGNOSIS — O99820 Streptococcus B carrier state complicating pregnancy: Secondary | ICD-10-CM

## 2023-02-02 DIAGNOSIS — O09523 Supervision of elderly multigravida, third trimester: Secondary | ICD-10-CM

## 2023-02-02 MED ORDER — DIPHENHYDRAMINE HCL 25 MG PO CAPS: 25.0000 mg | ORAL_CAPSULE | Freq: Four times a day (QID) | ORAL | Status: DC | PRN

## 2023-02-02 MED ORDER — SIMETHICONE 80 MG PO CHEW: 80.0000 mg | CHEWABLE_TABLET | ORAL | Status: DC | PRN

## 2023-02-02 MED ORDER — TRANEXAMIC ACID-NACL 1000-0.7 MG/100ML-% IV SOLN: 1000.0000 mg | INTRAVENOUS | Status: AC

## 2023-02-02 MED ORDER — DIBUCAINE (PERIANAL) 1 % EX OINT: 1.0000 | TOPICAL_OINTMENT | CUTANEOUS | Status: DC | PRN

## 2023-02-02 MED ORDER — WITCH HAZEL-GLYCERIN EX PADS: 1.0000 | MEDICATED_PAD | CUTANEOUS | Status: DC | PRN

## 2023-02-02 MED ORDER — ZOLPIDEM TARTRATE 5 MG PO TABS: 5.0000 mg | ORAL_TABLET | Freq: Every evening | ORAL | Status: DC | PRN

## 2023-02-02 MED ORDER — ONDANSETRON HCL 4 MG/2ML IJ SOLN: 4.0000 mg | INTRAMUSCULAR | Status: DC | PRN

## 2023-02-02 MED ORDER — IBUPROFEN 600 MG PO TABS
600.0000 mg | ORAL_TABLET | Freq: Four times a day (QID) | ORAL | Status: DC
  Administered 2023-02-02 – 2023-02-04 (×10): 600 mg via ORAL
  Filled 2023-02-02 (×10): qty 1

## 2023-02-02 MED ORDER — OXYTOCIN-SODIUM CHLORIDE 30-0.9 UT/500ML-% IV SOLN: 2.5000 [IU]/h | INTRAVENOUS | Status: DC | PRN

## 2023-02-02 MED ORDER — BENZOCAINE-MENTHOL 20-0.5 % EX AERO
1.0000 | INHALATION_SPRAY | CUTANEOUS | Status: DC | PRN
  Administered 2023-02-02 – 2023-02-04 (×2): 1 via TOPICAL
  Filled 2023-02-02 (×2): qty 56

## 2023-02-02 MED ORDER — PRENATAL MULTIVITAMIN CH
1.0000 | ORAL_TABLET | Freq: Every day | ORAL | Status: DC
  Administered 2023-02-02 – 2023-02-04 (×3): 1 via ORAL
  Filled 2023-02-02 (×3): qty 1

## 2023-02-02 MED ORDER — TRANEXAMIC ACID-NACL 1000-0.7 MG/100ML-% IV SOLN
INTRAVENOUS | Status: AC
  Administered 2023-02-02: 1000 mg via INTRAVENOUS
  Filled 2023-02-02: qty 100

## 2023-02-02 MED ORDER — PREPLUS 27-1 MG PO TABS: 1.0000 | ORAL_TABLET | Freq: Every day | ORAL | Status: DC

## 2023-02-02 MED ORDER — COCONUT OIL OIL: 1.0000 | TOPICAL_OIL | Status: DC | PRN

## 2023-02-02 MED ORDER — SENNOSIDES-DOCUSATE SODIUM 8.6-50 MG PO TABS
2.0000 | ORAL_TABLET | Freq: Every evening | ORAL | Status: DC | PRN
  Administered 2023-02-03: 2 via ORAL
  Filled 2023-02-02 (×2): qty 2

## 2023-02-02 MED ORDER — ACETAMINOPHEN 325 MG PO TABS
650.0000 mg | ORAL_TABLET | ORAL | Status: DC | PRN
  Administered 2023-02-02: 650 mg via ORAL
  Filled 2023-02-02: qty 2

## 2023-02-02 MED ORDER — ONDANSETRON HCL 4 MG PO TABS: 4.0000 mg | ORAL_TABLET | ORAL | Status: DC | PRN

## 2023-02-02 MED ORDER — TETANUS-DIPHTH-ACELL PERTUSSIS 5-2.5-18.5 LF-MCG/0.5 IM SUSY: 0.5000 mL | PREFILLED_SYRINGE | Freq: Once | INTRAMUSCULAR | Status: DC

## 2023-02-02 MED ORDER — OXYCODONE HCL 5 MG PO TABS
5.0000 mg | ORAL_TABLET | Freq: Four times a day (QID) | ORAL | Status: DC | PRN
  Administered 2023-02-02 – 2023-02-03 (×3): 5 mg via ORAL
  Filled 2023-02-02 (×3): qty 1

## 2023-02-02 MED ORDER — DOCUSATE SODIUM 100 MG PO CAPS: 100.0000 mg | ORAL_CAPSULE | Freq: Two times a day (BID) | ORAL | Status: DC | PRN

## 2023-02-02 NOTE — Discharge Summary (Signed)
Postpartum Discharge Summary      Patient Name: [name] DOB: 25-May-1976 MRN: [id number]  Date of admission: 02/01/2023 Delivery date:02/02/2023 Delivering provider: Joanne Gavel Date of discharge: 02/04/2023  Admitting diagnosis: Pregnancy at 40/0. IOL for AMA Additional problems: none    Discharge diagnosis: Term Pregnancy Delivered                                              Post partum procedures: none Augmentation: Cytotec Complications: None  Hospital course: Induction of Labor With Vaginal Delivery   47 y.o. yo G2P1011 at [redacted]w[redacted]d was admitted to the hospital 02/01/2023 for induction of labor.  Patient had an uncomplicated labor course.  Membrane Rupture Time/Date: 6:12 PM,02/01/2023  Delivery Method:Vaginal, Spontaneous Operative Delivery:N/A Episiotomy: None Lacerations:  2nd degree;Perineal Details of delivery can be found in separate delivery note.  Patient had a postpartum course complicated by nothing. Patient is discharged home 02/04/23.  Newborn Data: Birth date:02/02/2023 Birth time:1:55 AM Gender:Female Living status:Living Apgars:8 ,9  Weight:3170 g  Magnesium Sulfate received: No BMZ received: No Rhophylac:N/A  Transfusion:No  Physical exam  Vitals:   02/03/23 0556 02/03/23 1438 02/03/23 2223 02/04/23 0520  BP: 111/70 108/67 (!) 106/55 114/66  Pulse: 73 88 85 81  Resp: 18 18 18 18   Temp: 97.9 F (36.6 C) 98.6 F (37 C) 98.3 F (36.8 C) 98.3 F (36.8 C)  TempSrc: Oral Oral Oral Oral  SpO2: 100% 100% 99% 99%  Weight:      Height:       General: alert, cooperative, and no distress Lochia: appropriate Uterine Fundus: firm Incision: N/A DVT Evaluation: No evidence of DVT seen on physical exam. Labs: Lab Results  Component Value Date   WBC 9.1 02/01/2023   HGB 12.9 02/01/2023   HCT 39.1 02/01/2023   MCV 91.8 02/01/2023   PLT 200 02/01/2023      Latest Ref Rng & Units 08/16/2022   11:00 PM  CMP  Glucose 70 - 99 mg/dL  76   BUN 6 - 20 mg/dL 14   Creatinine 2.95 - 1.00 mg/dL 2.84   Sodium 132 - 440 mmol/L 128   Potassium 3.5 - 5.1 mmol/L 3.6   Chloride 98 - 111 mmol/L 102   CO2 22 - 32 mmol/L 19   Calcium 8.9 - 10.3 mg/dL 8.8   Total Protein 6.5 - 8.1 g/dL 7.2   Total Bilirubin 0.3 - 1.2 mg/dL 0.4   Alkaline Phos 38 - 126 U/L 40   AST 15 - 41 U/L 19   ALT 0 - 44 U/L 18    Edinburgh Score:    02/02/2023    2:55 PM  Edinburgh Postnatal Depression Scale Screening Tool  I have been able to laugh and see the funny side of things. 0  I have looked forward with enjoyment to things. 0  I have blamed myself unnecessarily when things went wrong. 2  I have been anxious or worried for no good reason. 0  I have felt scared or panicky for no good reason. 0  Things have been getting on top of me. 2  I have been so unhappy that I have had difficulty sleeping. 0  I have felt sad or miserable. 0  I have been so unhappy that I have been crying. 0  The thought of harming myself has  occurred to me. 0  Edinburgh Postnatal Depression Scale Total 4      After visit meds:  Allergies as of 02/04/2023       Reactions   Penicillins Anaphylaxis   Celery Oil    Latex Rash   itching        Medication List     STOP taking these medications    aspirin EC 81 MG tablet   cyclobenzaprine 10 MG tablet Commonly known as: FLEXERIL   Echinacea 125 MG Caps       TAKE these medications    acetaminophen 500 MG tablet Commonly known as: TYLENOL Take 1,000 mg by mouth every 6 (six) hours as needed for moderate pain.   BIOTIN PO Take 1 tablet by mouth daily.   ibuprofen 600 MG tablet Commonly known as: ADVIL Take 1 tablet (600 mg total) by mouth every 6 (six) hours.   PrePLUS 27-1 MG Tabs Take 1 tablet by mouth daily.         Discharge home in stable condition Infant Feeding: Bottle and Breast Infant Disposition:home with mother Discharge instruction: per After Visit Summary and Postpartum  booklet. Activity: Advance as tolerated. Pelvic rest for 6 weeks.  Diet: routine diet Anticipated Birth Control:  declines Postpartum Appointment:4 weeks Additional Postpartum F/U:  none Future Appointments:No future appointments. Follow up Visit:  Follow-up Information     Department, [facility]. Schedule an appointment as soon as possible for a visit in 4 week(s).   Why: For your postpartum appointment Contact information: [address] [place] [address] [phone]                     02/04/2023 Silvano Bilis, MD

## 2023-02-02 NOTE — Anesthesia Postprocedure Evaluation (Signed)
Anesthesia Post Note  Patient: [name]  Procedure(s) Performed: AN AD HOC LABOR EPIDURAL     Patient location during evaluation: Mother Baby Anesthesia Type: Epidural Level of consciousness: awake and alert and oriented Pain management: satisfactory to patient Vital Signs Assessment: post-procedure vital signs reviewed and stable Respiratory status: respiratory function stable Cardiovascular status: stable Postop Assessment: no headache, no backache, epidural receding, patient able to bend at knees, no signs of nausea or vomiting, adequate PO intake and able to ambulate Anesthetic complications: no   No notable events documented.  Last Vitals:  Vitals:   02/02/23 0445 02/02/23 0558  BP: 104/69 (!) 104/56  Pulse: (!) 107 (!) 104  Resp: 18 16  Temp: 37.8 C 37.6 C  SpO2: 99% 100%    Last Pain:  Vitals:   02/02/23 0558  TempSrc: Oral  PainSc: 7    Pain Goal:                   [name]

## 2023-02-03 NOTE — Progress Notes (Addendum)
POSTPARTUM PROGRESS NOTE  Post Partum Day 1  Subjective:  [name] is a 47 y.o. G2P1011 s/p SVD at [redacted]w[redacted]d.  No acute events overnight. She denies any problems with ambulating, voiding or po intake. Denies nausea or vomiting.  Pain is well controlled.  Lochia is less than a period.  Objective: Blood pressure 111/70, pulse 73, temperature 97.9 F (36.6 C), temperature source Oral, resp. rate 18, height 5\' 3"  (1.6 m), weight 77.6 kg, last menstrual period 05/23/2022, SpO2 100%, unknown if currently breastfeeding.  Physical Exam:  General: alert, cooperative and no distress Chest: no respiratory distress.  Breast: non tender, non erythematous, no masses appreciated. Right nipple appeared very slightly more swollen than right, c/w onset of breast feeding. No fissures appreciated. Heart:regular rate, distal pulses intact Abd: +hernia present, easily reducible Uterine Fundus: firm, appropriately tender, below umbilicus DVT Evaluation: No calf swelling or tenderness Extremities: no pedal edema Skin: warm, dry  Recent Labs    02/01/23 0930  HGB 12.9  HCT 39.1    Assessment/Plan: [name] is a 47 y.o. G2P1011 s/p SVD at [redacted]w[redacted]d   PPD#1 - Doing well  Routine postpartum care  Abdominal hernia: easily reducible  rec outpatient surgery referral  Contraception: declines Feeding: breast Dispo: Plan for discharge tomorrow.   LOS: 2 days   Paschal Dopp, MD 02/03/2023, 12:42 PM   Attestation of Supervision of Student:  I confirm that I have verified the information documented in the  resident 's note and that I have also personally reperformed the history, physical exam and all medical decision making activities.  I have verified that all services and findings are accurately documented in this student's note; and I agree with management and plan as outlined in the documentation. I have also made any necessary editorial changes.  Sundra Aland, MD Center for  [facility], [facility] 02/03/2023 4:27 PM

## 2023-02-03 NOTE — Social Work (Signed)
CSW acknowledged consult and completed a clinical assessment.  There are no barriers to d/c.  Clinical assessment notes will be entered at a later time.  Kelsie Bowser, LCSWA Clinical Social Worker [phone]  

## 2023-02-03 NOTE — Lactation Note (Signed)
This note was copied from a baby's chart. Lactation Consultation Note  Patient Name: [name] IRJJO'A Date: 02/03/2023 Age:45 hours Reason for consult: Initial assessment;1st time breastfeeding;Term, See Birth Parent's MR  Per Birth Parent,  she wants to BF but she has  mostly been formula feeding due infant not wanting to latch.  LC assisted Birth Parent on latching infant on the breast. Birth Parent latched infant on her right breast using the football hold position, infant latched with depth, infant BF for 14 minutes, afterwards Birth Parents choice to supplement with formula infant consumed 12 mls of 20 kcal Similac 360 with iron. Formula  was changed at [facility] Parent request, she feels infant having a lot of spit up with Enfamil products. Infant was pace feed and did not spit up Similac 20 kcal formula. LC explained how to use DEBP and Birth Parent was pumping as LC left the room. Per Birth Parent, she is experiencing cramping when infant latch and when using the DEBP, recently given pain medications by RN . LC explained that cramping is normal with breastfeeding first few days PP. LC discussed importance of maternal rest, diet and hydration. Birth Parent has order DEBP. Infant had 2 voids and 7 stools since birth. Birth Parent was  made aware of O/P services, breastfeeding support groups, community resources, and our phone # for post-discharge questions.    Today's feeding plan:  1- Birth Parent will latch infant first for every feeding, by cues, every 2 -3 hours, with lots of skin to skin. 2- Birth Parent will request latch assistance if needed. 3-After latching infant at the breast, Birth Parent's feeding choice, will supplement infant first with any EBM and then offer formula. Birth Parent given handout " Feeding Amounts". 4- Birth Parent will continue to use DEBP every 3 hours for 15 minutes or at her discretion now that infant is latching well at the breast.   Maternal  Data Has patient been taught Hand Expression?: Yes Does the patient have breastfeeding experience prior to this delivery?: No  Feeding Mother's Current Feeding Choice: Breast Milk and Formula Nipple Type: Extra Slow Flow  LATCH Score Latch: Grasps breast easily, tongue down, lips flanged, rhythmical sucking.  Audible Swallowing: A few with stimulation  Type of Nipple: Everted at rest and after stimulation  Comfort (Breast/Nipple): Soft / non-tender  Hold (Positioning): Assistance needed to correctly position infant at breast and maintain latch.  LATCH Score: 8   Lactation Tools Discussed/Used Tools: Pump;Flanges Flange Size: 21 Breast pump type: Double-Electric Breast Pump Pump Education: Milk Storage;Setup, frequency, and cleaning Reason for Pumping: Infant mostly been FO feed 1st 24 hours, Birth Parent desires to BF, pumping to help with breast stimulation and establishing milk supply. Pumping frequency: Birth Parent will continue to use DEBP every 3 hours for15 minutes.  Interventions Interventions: Breast feeding basics reviewed;Assisted with latch;Skin to skin;Breast compression;Adjust position;Support pillows;Position options;Education;LC Services brochure;Pace feeding  Discharge    Consult Status Consult Status: Follow-up Date: 02/03/23 Follow-up type: In-patient    Frederico Hamman 02/03/2023, 12:18 AM

## 2023-02-03 NOTE — Clinical Social Work Maternal (Signed)
CLINICAL SOCIAL WORK MATERNAL/CHILD NOTE   Patient Details  Name: [name] MRN: [id number] Date of Birth:    Date:  2020-07-07   Clinical Social Worker Initiating Note:  Willaim Rayas Dairon Procter      Date/Time: Initiated:  02/03/23/1526      Child's Name:  [name] 25-Sep-2020    Biological Parents:  Mother [name] )    Need for Interpreter:  None    Reason for Referral:  Current Substance Use/Substance Use During Pregnancy      Address:  [address] [place] [address]    Phone number:  [phone] (home)      Additional phone number:    Household Members/Support Persons (HM/SP):         HM/SP Name Relationship DOB or Age  HM/SP -1     HM/SP -2     HM/SP -3     HM/SP -4     HM/SP -5     HM/SP -6     HM/SP -7     HM/SP -8         Natural Supports (not living in the home):  Extended Family    Professional Supports: None    Employment: Unemployed    Type of Work:      Education:  Some Economist arranged:     Surveyor, quantity Resources:  Medicaid    Other Resources:  Sales executive  , WIC    Cultural/Religious Considerations Which May Impact Care:     Strengths:  Home prepared for child  , Ability to meet basic needs  , Pediatrician chosen    Psychotropic Medications:          Pediatrician:    Armed forces operational officer area   Pediatrician List:    [facility] ([address] [employer])  [place]   [place]       Pediatrician Fax Number:     Risk Factors/Current Problems:  None    Cognitive State:  Able to Concentrate  , Alert      Mood/Affect:  Calm  , Comfortable      CSW Assessment: CSW received consult for hx anxiety, and depression and THC use during pregnancy. CSW met with MOB to complete assessment and offer support. CSW entered the room and observed up in bed, holding the infant. CSW introduced self, CSW role  and reason for visit, MOB was agreeable to visit. CSW inquired about how MOB was feeling, MOB reported good. CSW confirmed MOB address and phone number, MOB verified the address and phone number on file were correct.    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 MOB reported she participated in therapy for about 2 years but none currently. MOB reported she talking to her brother and tries to remain positive to cope. MOB reported no current concerns with her MH. CSW assessed for safety, MOB denied any SI, HI or DV. CSW provided education regarding the baby blues period vs. perinatal mood disorders, discussed treatment and gave resources for mental health follow up if concerns arise.  CSW recommends self-evaluation during the postpartum time period using the New Mom Checklist from Postpartum Progress and encouraged MOB to contact a medical professional if symptoms are noted at any time.  MOB identified her brother, cousins an friends as her supports.  CSW inquired about MOB THC use, MOB reported she used THC until she found out she was pregnant, MOB reported she did not know she was pregnant until about 3 months. CSW explained the hospital drug screen policy and notified MOB infant UDS was negative, and CDS was pending, MOB verbalized understanding.    CSW provided review of Sudden Infant Death Syndrome (SIDS) precautions.  MOB reported she has all necessary items for the infant including a bassinet and car seat.  CSW identifies no further need for intervention and no barriers to discharge at this time.   CSW Plan/Description:  No Further Intervention Required/No Barriers to Discharge, Sudden Infant Death Syndrome (SIDS) Education, CSW Will Continue to Monitor Umbilical Cord Tissue Drug Screen Results and Make Report if [facility] Drug Screen Policy Information, Perinatal Mood and Anxiety Disorder (PMADs) Education      Maud Deed, LCSW 10-04-2022, 3:29 PM

## 2023-02-04 MED ORDER — IBUPROFEN 600 MG PO TABS: 600.0000 mg | ORAL_TABLET | Freq: Four times a day (QID) | ORAL | 0 refills | Status: AC

## 2023-03-07 ENCOUNTER — Telehealth (HOSPITAL_COMMUNITY): Payer: Self-pay | Admitting: *Deleted

## 2023-03-07 NOTE — Telephone Encounter (Signed)
03/07/2023  Name: [name] MRN: [id number] DOB: 1976/04/06  Reason for Call:  Transition of Care Hospital Discharge Call  Contact Status: Patient Contact Status: Message  Language assistant needed:          Follow-Up Questions:    Inocente Salles Postnatal Depression Scale:  In the Past 7 Days:    PHQ2-9 Depression Scale:     Discharge Follow-up:    Post-discharge interventions: NA  Salena Saner, RN 03/07/2023 15:02

## 2023-03-25 ENCOUNTER — Inpatient Hospital Stay (HOSPITAL_COMMUNITY)
Admission: AD | Admit: 2023-03-25 | Discharge: 2023-03-25 | Disposition: A | Discharge status: Home / Self Care | Payer: Medicaid Other | Attending: Obstetrics & Gynecology | Admitting: Obstetrics & Gynecology

## 2023-03-25 DIAGNOSIS — O9089 Other complications of the puerperium, not elsewhere classified: Secondary | ICD-10-CM | POA: Insufficient documentation

## 2023-03-25 DIAGNOSIS — M25552 Pain in left hip: Secondary | ICD-10-CM | POA: Insufficient documentation

## 2023-03-25 NOTE — MAU Note (Signed)
.  [name] is a 47 y.o. at Unknown here in MAU reporting: c/o pain in her left hip that started yesterday after she had layed her baby down. Had been cleaning all day and not sure if she twisted something. PP 02/02/23 vag delivery. Had taken tylenol and muscle relaxer lst night but did not help much. Has not taken anything today.  LMP:  Onset of complaint: yesterday.  Pain score: 10 There were no vitals filed for this visit.   FHT:n/a  Lab orders placed from triage:

## 2023-03-25 NOTE — MAU Provider Note (Signed)
S Ms. [name] is a 47 y.o. G2P1011 patient who presents to MAU today with complaint of left hip pain. She is 7 weeks postpartum, and pain is acute and started yesterday. No injury or trauma that she can recall.  O BP 132/81   Pulse 83   Temp 98.3 F (36.8 C)   Resp 18   Ht 5\' 3"  (1.6 m)   Wt 73 kg   BMI 28.52 kg/m  Physical Exam Constitutional:      General: She is not in acute distress.    Appearance: Normal appearance. She is not ill-appearing.  HENT:     Head: Normocephalic and atraumatic.  Cardiovascular:     Rate and Rhythm: Normal rate.  Pulmonary:     Effort: Pulmonary effort is normal.     Breath sounds: Normal breath sounds.  Abdominal:     Palpations: Abdomen is soft.     Tenderness: There is no abdominal tenderness. There is no guarding.  Musculoskeletal:        General: Normal range of motion.  Skin:    General: Skin is warm and dry.     Findings: No rash.  Neurological:     General: No focal deficit present.     Mental Status: She is alert and oriented to person, place, and time.     A Medical screening exam complete Pain of left hip - Plan: Discharge patient  P Discharge from MAU in stable condition Patient given the option of transfer to [facility] for further evaluation or seek care in outpatient facility of choice  List of options for follow-up given  Warning signs for worsening condition that would warrant emergency follow-up discussed Patient may return to MAU as needed   Sundra Aland, MD 03/25/2023 12:56 PM

## 2023-03-25 NOTE — Discharge Instructions (Signed)
URGENT CARES IN THE AREA:  [facility] Urgent Care at [facility] [address] [place], [place], [place] [address] [phone]  [facility] Urgent Care at [facility] [address] [place], [place], [place] [address] [phone]  [facility] Urgent Care [address], [place], [place] [address] [phone]  [facility] Urgent [facility] [address] [place], [place], [place] [address] [phone]  [facility] Urgent Care - [facility] [address] [place], [place], [place] [address]   EMERGENCY ROOMS IN THE AREA:  Eligha Bridegroom. [facility] Emergency Room [address] [place], [place], [place] [address] [phone]  [facility] Emergency Room [address] [place], [place], [place] [address] [phone]  [facility] Emergency Department at [facility] [address], [place], [place] [address] [phone]

## 2023-03-26 NOTE — Plan of Care (Signed)
[facility] Tonsillectomy/Adenoidectomy, Postoperative PEDS care plan entered in error.
